# Patient Record
Sex: Female | Born: 1985 | Race: Black or African American | Hispanic: No | Marital: Married
Health system: Southern US, Community
[De-identification: ages and names within clinical notes are randomized; demographics above are authoritative.]

## PROBLEM LIST (undated history)

## (undated) DIAGNOSIS — F419 Anxiety disorder, unspecified: Secondary | ICD-10-CM

## (undated) DIAGNOSIS — E785 Hyperlipidemia, unspecified: Secondary | ICD-10-CM

## (undated) DIAGNOSIS — R7303 Prediabetes: Secondary | ICD-10-CM

## (undated) DIAGNOSIS — I10 Essential (primary) hypertension: Secondary | ICD-10-CM

## (undated) DIAGNOSIS — Z973 Presence of spectacles and contact lenses: Secondary | ICD-10-CM

## (undated) DIAGNOSIS — D219 Benign neoplasm of connective and other soft tissue, unspecified: Secondary | ICD-10-CM

## (undated) HISTORY — PX: CHOLECYSTECTOMY, LAPAROSCOPIC: SHX56

## (undated) HISTORY — PX: CHOLECYSTECTOMY: SHX55

---
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
## 2004-03-14 ENCOUNTER — Emergency Department (HOSPITAL_COMMUNITY): Admission: EM | Admit: 2004-03-14 | Discharge: 2004-03-14 | Payer: Self-pay | Admitting: Emergency Medicine

## 2004-07-12 HISTORY — PX: LAPAROSCOPIC APPENDECTOMY: SHX408

## 2004-10-13 ENCOUNTER — Emergency Department (HOSPITAL_COMMUNITY): Admission: EM | Admit: 2004-10-13 | Discharge: 2004-10-13 | Payer: Self-pay | Admitting: Emergency Medicine

## 2005-04-12 ENCOUNTER — Emergency Department (HOSPITAL_COMMUNITY): Admission: EM | Admit: 2005-04-12 | Discharge: 2005-04-12 | Payer: Self-pay | Admitting: Emergency Medicine

## 2005-04-25 ENCOUNTER — Inpatient Hospital Stay (HOSPITAL_COMMUNITY): Admission: EM | Admit: 2005-04-25 | Discharge: 2005-04-26 | Payer: Self-pay | Admitting: Emergency Medicine

## 2005-04-25 ENCOUNTER — Encounter (INDEPENDENT_AMBULATORY_CARE_PROVIDER_SITE_OTHER): Payer: Self-pay | Admitting: *Deleted

## 2006-01-08 ENCOUNTER — Emergency Department (HOSPITAL_COMMUNITY): Admission: EM | Admit: 2006-01-08 | Discharge: 2006-01-08 | Payer: Self-pay | Admitting: *Deleted

## 2006-02-21 ENCOUNTER — Emergency Department (HOSPITAL_COMMUNITY): Admission: EM | Admit: 2006-02-21 | Discharge: 2006-02-21 | Payer: Self-pay | Admitting: Emergency Medicine

## 2006-05-09 ENCOUNTER — Emergency Department (HOSPITAL_COMMUNITY): Admission: EM | Admit: 2006-05-09 | Discharge: 2006-05-10 | Payer: Self-pay | Admitting: Emergency Medicine

## 2006-05-22 ENCOUNTER — Emergency Department (HOSPITAL_COMMUNITY): Admission: EM | Admit: 2006-05-22 | Discharge: 2006-05-22 | Payer: Self-pay | Admitting: Emergency Medicine

## 2006-08-12 ENCOUNTER — Emergency Department (HOSPITAL_COMMUNITY): Admission: EM | Admit: 2006-08-12 | Discharge: 2006-08-12 | Payer: Self-pay | Admitting: Emergency Medicine

## 2006-11-25 ENCOUNTER — Emergency Department (HOSPITAL_COMMUNITY): Admission: EM | Admit: 2006-11-25 | Discharge: 2006-11-26 | Payer: Self-pay | Admitting: Emergency Medicine

## 2006-11-29 ENCOUNTER — Emergency Department (HOSPITAL_COMMUNITY): Admission: EM | Admit: 2006-11-29 | Discharge: 2006-11-29 | Payer: Self-pay | Admitting: Emergency Medicine

## 2006-12-05 ENCOUNTER — Emergency Department (HOSPITAL_COMMUNITY): Admission: EM | Admit: 2006-12-05 | Discharge: 2006-12-05 | Payer: Self-pay | Admitting: Emergency Medicine

## 2006-12-07 ENCOUNTER — Emergency Department (HOSPITAL_COMMUNITY): Admission: EM | Admit: 2006-12-07 | Discharge: 2006-12-07 | Payer: Self-pay | Admitting: Emergency Medicine

## 2007-03-03 ENCOUNTER — Inpatient Hospital Stay (HOSPITAL_COMMUNITY): Admission: AD | Admit: 2007-03-03 | Discharge: 2007-03-03 | Payer: Self-pay | Admitting: Obstetrics & Gynecology

## 2008-01-02 ENCOUNTER — Emergency Department (HOSPITAL_COMMUNITY): Admission: EM | Admit: 2008-01-02 | Discharge: 2008-01-02 | Payer: Self-pay | Admitting: Emergency Medicine

## 2010-11-27 NOTE — H&P (Signed)
NAMEMAYSOON, LOZADA NO.:  [id number]   MEDICAL RECORD NO.:  [id number]          PATIENT TYPE:  INP   LOCATION:  0103                         FACILITY:  [facility]   PHYSICIAN:  Vikki Ports, MDDATE OF BIRTH:  1986-01-01   DATE OF ADMISSION:  04/25/2005  DATE OF DISCHARGE:                                HISTORY & PHYSICAL   ADMISSION DIAGNOSES:  Abdominal pain,  probably acute appendicitis.   REFERRING PHYSICIAN:  Celene Kras, M.D.   HISTORY OF PRESENT ILLNESS:  The patient is a 25 year old black female with  a 2-day history of lower abdominal pain, particularly in the right lower  quadrant. She underwent workup in the emergency room that showed CT scan  consistent with probable acute appendicitis with thickening of her terminal  ileum and cecum. No free fluid was noted.  The patient had a white count of  26,000 with significant left shift.  She had a fever at home of 101.2 and a  fever in the emergency room of 101.  She does complain of nausea and had 1  episode of emesis after drinking the contrast.   PAST MEDICAL HISTORY:  None.   PAST SURGICAL HISTORY:  None.   SOCIAL HISTORY:  Significant for sexual history which is monogamous with 1  partner for the last 2 years.  She denies any vaginal discharge.   REVIEW OF SYSTEMS:  Significant only for the fever as above.   PHYSICAL EXAMINATION:  GENERAL:  Age-appropriate black female in no  distress.  VITAL SIGNS:  Temperature 101.4, blood pressure 146/102, heart rate 110,  respiratory rate 16.  HEENT:  Benign. Normocephalic and atraumatic.  Pupils equal, round, and  reactive to light.  NECK:  Supple and soft without thyromegaly or cervical adenopathy.  LUNGS:  Clear to auscultation and percussion x2.  HEART: Regular rate and rhythm except for tachycardia with normal PMI.  ABDOMEN: Soft and tender in the lower abdomen extending from the suprapubic  area across the right lower quadrant.  There is  significant rebound in this  area. There is no evidence of abdominal wall hernia or defects.  PELVIC: Exam shows no drainage and no cervical motion tenderness.  EXTREMITIES:  Exam shows no clubbing, cyanosis, or edema.   IMPRESSION:  Probable acute appendicitis.   PLAN:  Diagnostic laparoscopy and probable laparoscopic appendectomy.      Vikki Ports, MD  Electronically Signed     KRH/MEDQ  D:  04/25/2005  T:  04/25/2005  Job:  [id number]   cc:   Celene Kras, MD  Fax: [phone]

## 2010-11-27 NOTE — Op Note (Signed)
Margaret Hahn, Margaret Hahn NO.:  [id number]   MEDICAL RECORD NO.:  [id number]          PATIENT TYPE:  INP   LOCATION:  1609                         FACILITY:  [facility]   PHYSICIAN:  Vikki Ports, MDDATE OF BIRTH:  April 13, 1986   DATE OF PROCEDURE:  04/25/2005  DATE OF DISCHARGE:                                 OPERATIVE REPORT   PREOPERATIVE DIAGNOSIS:  Abdominal pain, probable acute appendicitis.   POSTOPERATIVE DIAGNOSES:  1.  Pelvic inflammatory disease.  2.  Peritonitis.   PROCEDURE:  Diagnostic laparoscopy and laparoscopic appendectomy.   SURGEON:  Vikki Ports, M.D.   ASSISTANT:  Thornton Park. Daphine Deutscher, M.D.   ANESTHESIA:  General.   DESCRIPTION OF PROCEDURE:  The patient was taken to the operating room,  placed in supine position.  After adequate general anesthesia was induced  using endotracheal tube, the abdomen was prepped and draped in the normal  sterile fashion.  Foley catheter was placed.  A right upper quadrant  incision was made and using the OptiVu technique, peritoneal access was  obtained and pneumoperitoneum was obtained.  Under direct visualization,  additional 5 mm trocar was placed in the right upper quadrant and a 12 mm  trocar was placed in the left lower quadrant.  Appendix was visualized and  appeared acutely inflamed and mostly with serositis.  There were a  significant amount of adhesions and free fluid in the pelvis consistent with  pelvic inflammatory disease.  The uterus was not actually visualized, but  there was significant edema down in the pelvis.  Edema and interloop  abscesses were taken down, and the pelvis was copiously irrigated.  The  mesoappendix was taken down using the harmonic scalpel and transected at its  base using a vascular white load GIA stapling device.  It was placed in an  EndoCatch bag and removed through the 12 mm port. After adequate irrigation  of the pelvis, pneumoperitoneum was released.   Trocars were removed.  Skin  incisions were closed with subcuticular 4-0 Monocryl and was injected with  Marcaine.  Steri-Strips and dressings were applied.  The patient tolerated  procedure well and went to PACU in good condition.  Cultures were taken, and  those are pending.  Other specimen is the appendix, which was sent for  pathologic evaluation.      Vikki Ports, MD  Electronically Signed     KRH/MEDQ  D:  04/25/2005  T:  04/25/2005  Job:  [id number]

## 2013-01-03 ENCOUNTER — Emergency Department (HOSPITAL_COMMUNITY)
Admission: EM | Admit: 2013-01-03 | Discharge: 2013-01-03 | Disposition: A | Discharge status: Home / Self Care | Payer: Self-pay | Attending: Emergency Medicine | Admitting: Emergency Medicine

## 2013-01-03 ENCOUNTER — Encounter (HOSPITAL_COMMUNITY): Payer: Self-pay | Admitting: Emergency Medicine

## 2013-01-03 DIAGNOSIS — N764 Abscess of vulva: Secondary | ICD-10-CM | POA: Insufficient documentation

## 2013-01-03 DIAGNOSIS — I10 Essential (primary) hypertension: Secondary | ICD-10-CM | POA: Insufficient documentation

## 2013-01-03 DIAGNOSIS — Z79899 Other long term (current) drug therapy: Secondary | ICD-10-CM | POA: Insufficient documentation

## 2013-01-03 DIAGNOSIS — L0291 Cutaneous abscess, unspecified: Secondary | ICD-10-CM

## 2013-01-03 HISTORY — DX: Essential (primary) hypertension: I10

## 2013-01-03 MED ORDER — SULFAMETHOXAZOLE-TRIMETHOPRIM 800-160 MG PO TABS: 2.0000 | ORAL_TABLET | Freq: Two times a day (BID) | ORAL | Status: DC

## 2013-01-03 MED ORDER — HYDROCODONE-ACETAMINOPHEN 5-325 MG PO TABS: 1.0000 | ORAL_TABLET | Freq: Four times a day (QID) | ORAL | Status: DC | PRN

## 2013-01-03 MED ORDER — OXYCODONE-ACETAMINOPHEN 5-325 MG PO TABS
2.0000 | ORAL_TABLET | Freq: Once | ORAL | Status: AC
  Administered 2013-01-03: 2 via ORAL
  Filled 2013-01-03: qty 2

## 2013-01-03 NOTE — ED Notes (Signed)
Pt states that she noticed a vaginal abscess yesterday.  States she has never had one before.

## 2013-01-03 NOTE — ED Provider Notes (Signed)
History    CSN: [id number] Arrival date & time 01/03/13  1233  First MD Initiated Contact with Patient 01/03/13 1342     Chief Complaint  Patient presents with  . Vaginal Abscess    (Consider location/radiation/quality/duration/timing/severity/associated sxs/prior Treatment) HPI Comments: Patient states that she has had an abscess of the right labia for the past week, which is gradually worsening.  She reports that she attempted to pop the area yesterday and had a small amount of drainage.  She reports that the area is tender.  She denies prior history of a vaginal abscess.  She denies fever or chills.  Denies nausea or vomiting.  Denies abdominal pain.  Denies vaginal discharge.  Denies history of DM.    The history is provided by the patient.   Past Medical History  Diagnosis Date  . Hypertension    Past Surgical History  Procedure Laterality Date  . Cholecystectomy     History reviewed. No pertinent family history. History  Substance Use Topics  . Smoking status: Never Smoker   . Smokeless tobacco: Not on file  . Alcohol Use: No   OB History   Grav Para Term Preterm Abortions TAB SAB Ect Mult Living                 Review of Systems  Skin: Positive for color change.       abscess    Allergies  Review of patient's allergies indicates no known allergies.  Home Medications   Current Outpatient Rx  Name  Route  Sig  Dispense  Refill  . ibuprofen (ADVIL,MOTRIN) 200 MG tablet   Oral   Take 600 mg by mouth every 8 (eight) hours as needed for pain.         Marland Kitchen lisinopril-hydrochlorothiazide (PRINZIDE,ZESTORETIC) 20-12.5 MG per tablet   Oral   Take 1 tablet by mouth daily.          BP 150/85  Pulse 100  Temp(Src) 98.6 F (37 C) (Oral)  Resp 16  SpO2 95%  LMP 12/25/2012 Physical Exam  Nursing note and vitals reviewed. Constitutional: She appears well-developed and well-nourished.  HENT:  Head: Normocephalic and atraumatic.  Cardiovascular: Normal  rate, regular rhythm and normal heart sounds.   Pulmonary/Chest: Effort normal and breath sounds normal.  Abdominal: Soft. There is no tenderness.  Genitourinary:     Neurological: She is alert.  Skin: Skin is warm and dry.  Psychiatric: She has a normal mood and affect.    ED Course  Procedures (including critical care time) Labs Reviewed - No data to display No results found. No diagnosis found.  INCISION AND DRAINAGE Performed by: Anne Shutter, Sahej Hauswirth Consent: Verbal consent obtained. Risks and benefits: risks, benefits and alternatives were discussed Type: abscess  Body area: right labia majora  Anesthesia: local infiltration  Incision was made with a scalpel.  Local anesthetic: lidocaine 2% with epinephrine  Anesthetic total: 6 ml  Complexity: complex Blunt dissection to break up loculations  Drainage: purulent  Drainage amount: small  Patient tolerance: Patient tolerated the procedure well with no immediate complications.     MDM  Patient presenting with an abscess of the right labia majora.  Incision and drainage performed, but little drainage was expressed.  Patient afebrile and non toxic appearing.  Patient discharged home with antibiotic and instructed to apply warm compresses to the area.  Patient stable for discharge.  Given referral to Gynecology.  Return precautions given.  Pascal Lux [place], PA-C 01/03/13  2303 

## 2013-01-03 NOTE — Progress Notes (Signed)
[facility] has seen patient and provided her with a list of primary care resources, highlighting [facility].

## 2013-01-04 NOTE — ED Provider Notes (Signed)
Medical screening examination/treatment/procedure(s) were performed by non-physician practitioner and as supervising physician I was immediately available for consultation/collaboration.   Benny Lennert, MD 01/04/13 [phone]

## 2013-04-10 ENCOUNTER — Encounter (HOSPITAL_COMMUNITY): Payer: Self-pay | Admitting: Emergency Medicine

## 2013-04-10 ENCOUNTER — Emergency Department (HOSPITAL_COMMUNITY)
Admission: EM | Admit: 2013-04-10 | Discharge: 2013-04-10 | Disposition: A | Discharge status: Home / Self Care | Payer: Self-pay | Attending: Emergency Medicine | Admitting: Emergency Medicine

## 2013-04-10 DIAGNOSIS — N938 Other specified abnormal uterine and vaginal bleeding: Secondary | ICD-10-CM | POA: Insufficient documentation

## 2013-04-10 DIAGNOSIS — N949 Unspecified condition associated with female genital organs and menstrual cycle: Secondary | ICD-10-CM | POA: Insufficient documentation

## 2013-04-10 DIAGNOSIS — Z79899 Other long term (current) drug therapy: Secondary | ICD-10-CM | POA: Insufficient documentation

## 2013-04-10 DIAGNOSIS — A599 Trichomoniasis, unspecified: Secondary | ICD-10-CM

## 2013-04-10 DIAGNOSIS — Z3202 Encounter for pregnancy test, result negative: Secondary | ICD-10-CM | POA: Insufficient documentation

## 2013-04-10 DIAGNOSIS — I10 Essential (primary) hypertension: Secondary | ICD-10-CM | POA: Insufficient documentation

## 2013-04-10 DIAGNOSIS — A5909 Other urogenital trichomoniasis: Secondary | ICD-10-CM | POA: Insufficient documentation

## 2013-04-10 LAB — URINALYSIS, ROUTINE W REFLEX MICROSCOPIC
Bilirubin Urine: NEGATIVE
Glucose, UA: NEGATIVE mg/dL
Ketones, ur: NEGATIVE mg/dL
Nitrite: NEGATIVE
Protein, ur: NEGATIVE mg/dL
Specific Gravity, Urine: 1.022 (ref 1.005–1.030)
pH: 6 (ref 5.0–8.0)

## 2013-04-10 LAB — POCT I-STAT, CHEM 8
Glucose, Bld: 91 mg/dL (ref 70–99)
HCT: 37 % (ref 36.0–46.0)
Sodium: 140 mEq/L (ref 135–145)
TCO2: 24 mmol/L (ref 0–100)

## 2013-04-10 LAB — WET PREP, GENITAL

## 2013-04-10 LAB — CBC
HCT: 35.1 % — ABNORMAL LOW (ref 36.0–46.0)
Hemoglobin: 11.3 g/dL — ABNORMAL LOW (ref 12.0–15.0)
MCHC: 32.2 g/dL (ref 30.0–36.0)
Platelets: 325 10*3/uL (ref 150–400)
RBC: 3.98 MIL/uL (ref 3.87–5.11)
RDW: 14.8 % (ref 11.5–15.5)
WBC: 8.4 10*3/uL (ref 4.0–10.5)

## 2013-04-10 LAB — POCT PREGNANCY, URINE: Preg Test, Ur: NEGATIVE

## 2013-04-10 LAB — URINE MICROSCOPIC-ADD ON

## 2013-04-10 MED ORDER — IBUPROFEN 800 MG PO TABS
ORAL_TABLET | ORAL | Status: AC
  Filled 2013-04-10: qty 1

## 2013-04-10 MED ORDER — METRONIDAZOLE 500 MG PO TABS
ORAL_TABLET | ORAL | Status: AC
  Filled 2013-04-10: qty 4

## 2013-04-10 MED ORDER — METRONIDAZOLE 500 MG PO TABS
2000.0000 mg | ORAL_TABLET | Freq: Once | ORAL | Status: AC
  Administered 2013-04-10: 2000 mg via ORAL

## 2013-04-10 MED ORDER — IBUPROFEN 800 MG PO TABS
800.0000 mg | ORAL_TABLET | Freq: Once | ORAL | Status: AC
  Administered 2013-04-10: 800 mg via ORAL

## 2013-04-10 MED ORDER — ONDANSETRON 4 MG PO TBDP
ORAL_TABLET | ORAL | Status: AC
  Filled 2013-04-10: qty 1

## 2013-04-10 MED ORDER — ONDANSETRON 4 MG PO TBDP
4.0000 mg | ORAL_TABLET | Freq: Once | ORAL | Status: AC
  Administered 2013-04-10: 4 mg via ORAL

## 2013-04-10 NOTE — ED Provider Notes (Signed)
CSN: [id number]     Arrival date & time 04/10/13  1742 History   First MD Initiated Contact with Patient 04/10/13 1839     Chief Complaint  Patient presents with  . Vaginal Bleeding   (Consider location/radiation/quality/duration/timing/severity/associated sxs/prior Treatment) Patient is a 27 y.o. female presenting with vaginal bleeding. The history is provided by the patient.  Vaginal Bleeding Quality:  Clots and dark red Severity:  Moderate Duration:  9 months Timing:  Constant Menstrual history:  Irregular Number of pads used:  8 per day Possible pregnancy: no   Context: spontaneously   Relieved by: unable to tolerate OCP, Ineffective treatments: Unable to tolerate OCPs and metformin. Associated symptoms: no abdominal pain, no back pain, no dizziness, no dyspareunia, no dysuria, no fatigue, no fever, no nausea and no vaginal discharge   Risk factors: does not have multiple partners, no new sexual partner and no STD   Risk factors comment:  PCOS    Past Medical History  Diagnosis Date  . Hypertension    Past Surgical History  Procedure Laterality Date  . Cholecystectomy     No family history on file. History  Substance Use Topics  . Smoking status: Never Smoker   . Smokeless tobacco: Not on file  . Alcohol Use: No   OB History   Grav Para Term Preterm Abortions TAB SAB Ect Mult Living                 Review of Systems  Constitutional: Negative for fever and fatigue.  Gastrointestinal: Negative for nausea and abdominal pain.  Genitourinary: Positive for vaginal bleeding. Negative for dysuria, vaginal discharge and dyspareunia.  Musculoskeletal: Negative for back pain.  Neurological: Negative for dizziness and syncope.  All other systems reviewed and are negative.    Allergies  Review of patient's allergies indicates no known allergies.  Home Medications   Current Outpatient Rx  Name  Route  Sig  Dispense  Refill  . lisinopril-hydrochlorothiazide  (PRINZIDE,ZESTORETIC) 20-12.5 MG per tablet   Oral   Take 1 tablet by mouth daily.         . norgestimate-ethinyl estradiol (ORTHO-CYCLEN,SPRINTEC,PREVIFEM) 0.25-35 MG-MCG tablet   Oral   Take 1 tablet by mouth daily.          BP 127/77  Pulse 68  Temp(Src) 98.6 F (37 C) (Oral)  Resp 20  Wt 300 lb (136.079 kg)  SpO2 99% Physical Exam  Constitutional: She is oriented to person, place, and time. She appears well-developed and well-nourished. No distress.  HENT:  Head: Normocephalic and atraumatic.  Eyes: Conjunctivae are normal. No scleral icterus.  Neck: Normal range of motion.  Cardiovascular: Normal rate, regular rhythm and normal heart sounds.  Exam reveals no gallop and no friction rub.   No murmur heard. Pulmonary/Chest: Effort normal and breath sounds normal. No respiratory distress.  Abdominal: Soft. Bowel sounds are normal. She exhibits no distension and no mass. There is no tenderness. There is no guarding.  Musculoskeletal:  Pelvic exam: normal external genitalia, vulva, vagina, cervix, uterus and adnexa. Bleeding, from cervix.    Neurological: She is alert and oriented to person, place, and time.  Skin: Skin is warm and dry. She is not diaphoretic.    ED Course  Procedures (including critical care time) Labs Review Labs Reviewed  WET PREP, GENITAL - Abnormal; Notable for the following:    Trich, Wet Prep FEW (*)    Clue Cells Wet Prep HPF POC RARE (*)    WBC,  Wet Prep HPF POC FEW (*)    All other components within normal limits  URINALYSIS, ROUTINE W REFLEX MICROSCOPIC - Abnormal; Notable for the following:    APPearance CLOUDY (*)    Hgb urine dipstick TRACE (*)    Leukocytes, UA MODERATE (*)    All other components within normal limits  CBC - Abnormal; Notable for the following:    Hemoglobin 11.3 (*)    HCT 35.1 (*)    All other components within normal limits  GC/CHLAMYDIA PROBE AMP  URINE MICROSCOPIC-ADD ON  POCT PREGNANCY, URINE  POCT  I-STAT, CHEM 8   Imaging Review No results found.  MDM   1. Trichimoniasis   2. DUB (dysfunctional uterine bleeding)    BP 127/77  Pulse 68  Temp(Src) 98.6 F (37 C) (Oral)  Resp 20  Wt 300 lb (136.079 kg)  SpO2 99% Patient is trich positive. Pelvic exam is unremarkable. Will treat here with 2 g flagyl. Hgb stable. Follow up at the [facility]. Return precautions discussed.  Arthor Captain, PA-C 04/14/13 1723

## 2013-04-10 NOTE — ED Notes (Signed)
Patient with vaginal bleeding for nine months.  Patient saw her PCP and he put her on BC pills, but patient not able to tolerate taking them, made her nauseated.  In [place]. [place] she was started on Metformin to hopefully control the bleeding but that made her nauseated as well.  Patient also has intermittent lower abdominal pain that she rates as a 10 now.  No dysuria or other problem with urination.

## 2013-04-16 NOTE — ED Provider Notes (Signed)
  Medical screening examination/treatment/procedure(s) were performed by non-physician practitioner and as supervising physician I was immediately available for consultation/collaboration.    Gerhard Munch, MD 04/16/13 [phone]

## 2013-04-25 ENCOUNTER — Ambulatory Visit (INDEPENDENT_AMBULATORY_CARE_PROVIDER_SITE_OTHER): Payer: Self-pay | Admitting: Family Medicine

## 2013-04-25 ENCOUNTER — Encounter: Payer: Self-pay | Admitting: Family Medicine

## 2013-04-25 VITALS — BP 142/91 | HR 102 | Temp 98.4°F | Ht 66.0 in | Wt 297.2 lb

## 2013-04-25 DIAGNOSIS — N921 Excessive and frequent menstruation with irregular cycle: Secondary | ICD-10-CM | POA: Insufficient documentation

## 2013-04-25 DIAGNOSIS — I10 Essential (primary) hypertension: Secondary | ICD-10-CM

## 2013-04-25 DIAGNOSIS — E282 Polycystic ovarian syndrome: Secondary | ICD-10-CM | POA: Insufficient documentation

## 2013-04-25 MED ORDER — MEGESTROL ACETATE 40 MG PO TABS: 40.0000 mg | ORAL_TABLET | Freq: Every day | ORAL | Status: DC

## 2013-04-25 NOTE — Patient Instructions (Signed)
Metrorrhagia   Metrorrhagia is uterine bleeding at irregular intervals, especially between menstrual periods.   CAUSES    Dysfunctional uterine bleeding.   Uterine lining growing outside the uterus (endometriosis).   Embryo adhering to uterine wall (implantation).   Pregnancy growing in the fallopian tubes (ectopic pregnancy).   Miscarriage.   Menopause.   Cancer of the reproduction organs.   Certain drugs such as hormonal contraceptives.   Inherited bleeding disorders.   Trauma.   Uterine fibroids.   Sexually transmitted diseases (STDs).   Polycystic ovarian disease.  DIAGNOSIS   A history will be taken.   A physical exam will be performed.   Other tests may include:   Blood tests.   A pregnancy test.   An ultrasound of the abdomen and pelvis.   A biopsy of the uterine lining.   AMRI or CT scan of the abdomen and pelvis.  TREATMENT  Treatment will depend on the cause.  HOME CARE INSTRUCTIONS    Take all medicines as directed by your caregiver. Do not change or switch medicines without talking to your caregiver.   Take all iron supplements exactly as directed by your caregiver. Iron supplements help to replace the iron your body loses from irregular bleeding.If you become constipated, increase the amount of fiber, fruits, and vegetables in your diet.   Do not take aspirin or medicines that contain aspirin for 1 week before your menstrual period or during your menstrual period. Aspirin may increase the bleeding.   Rest as much as possible if you change your sanitary pad or tampon more than once every 2 hours.   Eat well-balanced meals including foods high in iron, such as green leafy vegetables, red meat, liver, eggs, and whole-grain breads and cereals.   Do not try to lose weight until the abnormal bleeding is controlled and your blood iron level is back to normal.  SEEK MEDICAL CARE IF:    You have nausea and vomiting, or you cannot keep foods down.   You feel dizzy or have diarrhea  while taking medicine.   You have any problems that may be related to the medicine you are taking.  SEEK IMMEDIATE MEDICAL CARE IF:    You have a fever.   You develop chills.   You become lightheaded or faint.   You need to change your sanitary pad or tampon more than once an hour.   Your bleeding becomesheavy.   You begin to pass clots or tissue.  MAKE SURE YOU:    Understand these instructions.   Will watch your condition.   Will get help right away if you are not doing well or get worse.  Document Released: 06/28/2005 Document Revised: 09/20/2011 Document Reviewed: 01/25/2011  ExitCare Patient Information 2014 [employer].

## 2013-04-25 NOTE — Progress Notes (Signed)
S: 27 y.o. F presents with complaints of persistent vaingal bleeding for the last 10 months. Pt has had long history of PCOS and has trialed metformin in the past. However since January pt has been having persistent vaginal bleeding that has impaired her life with sexual relationships and financially with pads. Pt reports 2-3 pads per day. Pt other wise is feeling well.   PSHx: Cholecystectomy PMhx: HTN, PCOS Meds: Zestoretic, reports not taking orthocyclen All: NKDA  PE: Filed Vitals:   04/25/13 1446  BP: 142/91  Pulse: 102  Temp: 98.4 F (36.9 C)   NAD, VSS, mildly hypertensive CTAB no w/r/c rrr no mgt +Facial hair, mild acanthosis migricans  CBC    Component Value Date/Time   WBC 8.4 04/10/2013 1915   RBC 3.98 04/10/2013 1915   HGB 12.6 04/10/2013 1919   HCT 37.0 04/10/2013 1919   PLT 325 04/10/2013 1915   MCV 88.2 04/10/2013 1915   MCH 28.4 04/10/2013 1915   MCHC 32.2 04/10/2013 1915   RDW 14.8 04/10/2013 1915   CMP     Component Value Date/Time   NA 140 04/10/2013 1919   K 3.7 04/10/2013 1919   CL 105 04/10/2013 1919   GLUCOSE 91 04/10/2013 1919   BUN 11 04/10/2013 1919   CREATININE 1.10 04/10/2013 1919   Pregnancy test neg 9/30 GC/C neg Trich + = reports treated  A/P 27 y.o. F with metrorrhagia likely 2/2 PCOS pt has been previously dx'd with. Pt currently is uninsured, however, she is planning on getting insurance in November. Pt's age makes it unlikely that she has uterine cancer at this time. However, will attempt to tx pt dysfunctional uterine bleeding with megace for stabalization. Then pt to return in 8 wks after she has established her insurance. Once established will further evalaute  - megace taper - f/u in 8 wks  Tawana Scale, MD [facility] Fellow

## 2013-09-04 ENCOUNTER — Emergency Department (HOSPITAL_COMMUNITY)
Admission: EM | Admit: 2013-09-04 | Discharge: 2013-09-04 | Disposition: A | Discharge status: Home / Self Care | Payer: Self-pay | Attending: Emergency Medicine | Admitting: Emergency Medicine

## 2013-09-04 ENCOUNTER — Emergency Department (HOSPITAL_COMMUNITY): Payer: Self-pay

## 2013-09-04 ENCOUNTER — Encounter (HOSPITAL_COMMUNITY): Payer: Self-pay | Admitting: Emergency Medicine

## 2013-09-04 ENCOUNTER — Ambulatory Visit (HOSPITAL_COMMUNITY): Admission: RE | Admit: 2013-09-04 | Payer: Self-pay | Source: Ambulatory Visit

## 2013-09-04 DIAGNOSIS — Y929 Unspecified place or not applicable: Secondary | ICD-10-CM | POA: Insufficient documentation

## 2013-09-04 DIAGNOSIS — I10 Essential (primary) hypertension: Secondary | ICD-10-CM | POA: Insufficient documentation

## 2013-09-04 DIAGNOSIS — S62609A Fracture of unspecified phalanx of unspecified finger, initial encounter for closed fracture: Secondary | ICD-10-CM | POA: Insufficient documentation

## 2013-09-04 DIAGNOSIS — W230XXA Caught, crushed, jammed, or pinched between moving objects, initial encounter: Secondary | ICD-10-CM | POA: Insufficient documentation

## 2013-09-04 DIAGNOSIS — Y939 Activity, unspecified: Secondary | ICD-10-CM | POA: Insufficient documentation

## 2013-09-04 DIAGNOSIS — Z79899 Other long term (current) drug therapy: Secondary | ICD-10-CM | POA: Insufficient documentation

## 2013-09-04 MED ORDER — HYDROCODONE-ACETAMINOPHEN 5-325 MG PO TABS: 1.0000 | ORAL_TABLET | Freq: Four times a day (QID) | ORAL | Status: DC | PRN

## 2013-09-04 NOTE — ED Provider Notes (Signed)
CSN: [id number]     Arrival date & time 09/04/13  1028 History   First MD Initiated Contact with Patient 09/04/13 1040     No chief complaint on file.    (Consider location/radiation/quality/duration/timing/severity/associated sxs/prior Treatment) HPI Comments: Patient presents to the ED with a chief complaint of finger injury.  She states that she jammed her finger last night.  She states that it hurts to move.  Nothing makes the pain better.  She has not tried taking anything to alleviate her symptoms.  Pain is moderate.  No numbness or tingling.  The history is provided by the patient. No language interpreter was used.    Past Medical History  Diagnosis Date  . Hypertension    Past Surgical History  Procedure Laterality Date  . Cholecystectomy     No family history on file. History  Substance Use Topics  . Smoking status: Never Smoker   . Smokeless tobacco: Not on file  . Alcohol Use: No   OB History   Grav Para Term Preterm Abortions TAB SAB Ect Mult Living                 Review of Systems  Constitutional: Negative for fever.  Musculoskeletal: Positive for arthralgias and joint swelling. Negative for myalgias.  Skin: Negative for wound.  Neurological: Negative for numbness.      Allergies  Review of patient's allergies indicates no known allergies.  Home Medications   Current Outpatient Rx  Name  Route  Sig  Dispense  Refill  . lisinopril-hydrochlorothiazide (PRINZIDE,ZESTORETIC) 20-12.5 MG per tablet   Oral   Take 1 tablet by mouth daily.          There were no vitals taken for this visit. Physical Exam  Nursing note and vitals reviewed. Constitutional: She is oriented to person, place, and time. She appears well-developed and well-nourished.  HENT:  Head: Normocephalic and atraumatic.  Eyes: Conjunctivae and EOM are normal.  Neck: Normal range of motion.  Cardiovascular: Normal rate and intact distal pulses.   Brisk capillary refill   Pulmonary/Chest: Effort normal.  Abdominal: She exhibits no distension.  Musculoskeletal: Normal range of motion.  Left index finger ROM and strength is reduced 2/2 pain, no obvious deformity or abnormality  Neurological: She is alert and oriented to person, place, and time.  Sensation intact  Skin: Skin is dry.  Psychiatric: She has a normal mood and affect. Her behavior is normal. Judgment and thought content normal.    ED Course  Procedures (including critical care time) Labs Review Labs Reviewed - No data to display Imaging Review No results found.  EKG Interpretation   None       MDM   Final diagnoses:  Finger fracture    Patient with jammed finger.  Will check plain films.  2:59 PM Patient with finger fracture. Attempted to straighten, and applied a splint. I also give the patient a digital block, which provided significant relief. Recommend followup with orthopedics/hand. Patient understands and agrees to plan. She is stable and ready for discharge.    Montine Circle, PA-C 09/04/13 1500

## 2013-09-04 NOTE — Discharge Instructions (Signed)
Finger Fracture  Fractures of fingers are breaks in the bones of the fingers. There are many types of fractures. There are different ways of treating these fractures. Your health care provider will discuss the best way to treat your fracture.  CAUSES  Traumatic injury is the main cause of broken fingers. These include:  · Injuries while playing sports.  · Workplace injuries.  · Falls.  RISK FACTORS  Activities that can increase your risk of finger fractures include:  · Sports.  · Workplace activities that involve machinery.  · A condition called osteoporosis, which can make your bones less dense and cause them to fracture more easily.  SIGNS AND SYMPTOMS  The main symptoms of a broken finger are pain and swelling within 15 minutes after the injury. Other symptoms include:  · Bruising of your finger.  · Stiffness of your finger.  · Numbness of your finger.  · Exposed bones (compound fracture) if the fracture is severe.  DIAGNOSIS   The best way to diagnose a broken bone is with X-ray imaging. Additionally, your health care provider will use this X-ray image to evaluate the position of the broken finger bones.   TREATMENT   Finger fractures can be treated with:   · Nonreduction This means the bones are in place. The finger is splinted without changing the positions of the bone pieces. The splint is usually left on for about a week to 10 days. This will depend on your fracture and what your health care provider thinks.  · Closed reduction The bones are put back into position without using surgery. The finger is then splinted.  · Open reduction and internal fixation The fracture site is opened. Then the bone pieces are fixed into place with pins or some type of hardware. This is seldom required. It depends on the severity of the fracture.  HOME CARE INSTRUCTIONS   · Follow your health care provider's instructions regarding activities, exercises, and physical therapy.  · Only take over-the-counter or prescription  medicines for pain, discomfort, or fever as directed by your health care provider.  SEEK MEDICAL CARE IF:  You have pain or swelling that limits the motion or use of your fingers.  SEEK IMMEDIATE MEDICAL CARE IF:   Your finger becomes numb.  MAKE SURE YOU:   · Understand these instructions.  · Will watch your condition.  · Will get help right away if you are not doing well or get worse.  Document Released: 10/10/2000 Document Revised: 04/18/2013 Document Reviewed: 02/07/2013  Exit[employer]® Patient Information ©2014 [employer].

## 2013-09-04 NOTE — ED Notes (Signed)
Pt reports pain and swelling in 2nd finger (index finger ) on r/hand.Pt reports that she accidentally bumped a wall will an outstreached hand. Decreased ROM note in finger. Pt AO x 3.

## 2013-09-05 NOTE — ED Provider Notes (Signed)
Medical screening examination/treatment/procedure(s) were performed by non-physician practitioner and as supervising physician I was immediately available for consultation/collaboration.  EKG Interpretation   None        Keydi Giel R. Kastin Cerda, MD 09/05/13 0650 

## 2015-05-05 ENCOUNTER — Emergency Department (HOSPITAL_COMMUNITY)
Admission: EM | Admit: 2015-05-05 | Discharge: 2015-05-05 | Disposition: A | Discharge status: Home / Self Care | Payer: 59 | Attending: Emergency Medicine | Admitting: Emergency Medicine

## 2015-05-05 ENCOUNTER — Encounter (HOSPITAL_COMMUNITY): Payer: Self-pay | Admitting: Vascular Surgery

## 2015-05-05 DIAGNOSIS — L259 Unspecified contact dermatitis, unspecified cause: Secondary | ICD-10-CM | POA: Diagnosis not present

## 2015-05-05 DIAGNOSIS — Z3202 Encounter for pregnancy test, result negative: Secondary | ICD-10-CM | POA: Diagnosis not present

## 2015-05-05 DIAGNOSIS — N898 Other specified noninflammatory disorders of vagina: Secondary | ICD-10-CM | POA: Diagnosis present

## 2015-05-05 DIAGNOSIS — Z79899 Other long term (current) drug therapy: Secondary | ICD-10-CM | POA: Diagnosis not present

## 2015-05-05 DIAGNOSIS — I10 Essential (primary) hypertension: Secondary | ICD-10-CM | POA: Diagnosis not present

## 2015-05-05 DIAGNOSIS — L309 Dermatitis, unspecified: Secondary | ICD-10-CM

## 2015-05-05 LAB — WET PREP, GENITAL
Trich, Wet Prep: NONE SEEN
Yeast Wet Prep HPF POC: NONE SEEN

## 2015-05-05 LAB — POC URINE PREG, ED: PREG TEST UR: NEGATIVE

## 2015-05-05 MED ORDER — FLUCONAZOLE 100 MG PO TABS
150.0000 mg | ORAL_TABLET | Freq: Once | ORAL | Status: AC
  Administered 2015-05-05: 100 mg via ORAL
  Filled 2015-05-05: qty 2

## 2015-05-05 NOTE — ED Provider Notes (Signed)
CSN: [id number]     Arrival date & time 05/05/15  1440 History  By signing my name below, I, Soijett Blue, attest that this documentation has been prepared under the direction and in the presence of Lenn Sink, PA-C Electronically Signed: Soijett Blue, ED Scribe. 05/05/2015. 3:43 PM.   Chief Complaint  Patient presents with  . Vaginal Itching      The history is provided by the patient. No language interpreter was used.    Margaret Hahn is a 29 y.o. female who presents to the Emergency Department complaining of vaginal itching vaginal itching/burning onset 1 week ago. She notes that she began to use a new soap 2 weeks ago and there is itching/burning/redness/swelling to her labia since using the new soap. Pt reports that she is sexually active but without penetration as she is a lesbian. She states that she is having associated symptoms of vaginal redness and swelling. She states that she has not tried any medication but she used coconut oil that temporarily relieved her symptoms. She denies vaginal discharge, fever, chills, and any other symptoms. She notes that she has had a yeast infection in the past and her symptoms are similar to that. Denies being on abx lately and she notes that she is not a diabetic but she does have HTN.     Past Medical History  Diagnosis Date  . Hypertension    Past Surgical History  Procedure Laterality Date  . Cholecystectomy     Family History  Problem Relation Age of Onset  . Diabetes Mother    Social History  Substance Use Topics  . Smoking status: Never Smoker   . Smokeless tobacco: None  . Alcohol Use: No   OB History    No data available     Review of Systems  Constitutional: Negative for fever and chills.  Genitourinary: Negative for vaginal discharge.       Vaginal itching/redness/swelling      Allergies  Review of patient's allergies indicates no known allergies.  Home Medications   Prior to Admission medications    Medication Sig Start Date End Date Taking? Authorizing Provider  HYDROcodone-acetaminophen (NORCO/VICODIN) 5-325 MG per tablet Take 1-2 tablets by mouth every 6 (six) hours as needed. 09/04/13   Montine Circle, PA-C  lisinopril-hydrochlorothiazide (PRINZIDE,ZESTORETIC) 20-12.5 MG per tablet Take 1 tablet by mouth daily.    Historical Provider, MD   BP 138/82 mmHg  Pulse 76  Temp(Src) 97.3 F (36.3 C) (Oral)  Resp 20  SpO2 98% Physical Exam  Constitutional: She is oriented to person, place, and time. She appears well-developed and well-nourished. No distress.  HENT:  Head: Normocephalic and atraumatic.  Eyes: EOM are normal.  Neck: Neck supple.  Cardiovascular: Normal rate.   Pulmonary/Chest: Effort normal. No respiratory distress.  Abdominal: Soft. There is no tenderness.  Genitourinary: Vagina normal. There is no rash, tenderness, lesion or injury on the right labia. There is no tenderness, lesion or injury on the left labia. Cervix exhibits no discharge. No erythema, tenderness or bleeding in the vagina. No foreign body around the vagina. No signs of injury around the vagina. No vaginal discharge found.  Light pink tinting to swab.   Musculoskeletal: Normal range of motion.  Neurological: She is alert and oriented to person, place, and time.  Skin: Skin is warm and dry.  Psychiatric: She has a normal mood and affect. Her behavior is normal.  Nursing note and vitals reviewed.   ED Course  Procedures (including  critical care time) DIAGNOSTIC STUDIES: Oxygen Saturation is 98% on RA, nl by my interpretation.    COORDINATION OF CARE: 3:44 PM Discussed treatment plan with pt at bedside which includes UA, pelvic exam, and wet prep and pt agreed to plan.    Labs Review Labs Reviewed  WET PREP, GENITAL - Abnormal; Notable for the following:    Clue Cells Wet Prep HPF POC FEW (*)    WBC, Wet Prep HPF POC FEW (*)    All other components within normal limits  POC URINE PREG, ED   CERVICOVAGINAL ANCILLARY ONLY    Imaging Review No results found. I have personally reviewed and evaluated these lab results as part of my medical decision-making.   EKG Interpretation None      MDM   Final diagnoses:  Dermatitis    Labs: wet prep: few clue cells and few WBC  Imaging:   Consults:   Therapeutics:   Discharge Meds: Fluconazole  Assessment/Plan: Patient presents with irritation to the labia major, no significant findings on exam. Patient reports this is identical to previous yeast infections, wet prep shows no signs of yeast, physical exam shows no signs. Patient insisting that this is identical to previous infection that was resolved with 1 dose of fluconazole. Patient will be given 1 dose of fluconazole, this does not improve she is instructed follow-up with her primary care for reevaluation and further management. Patient verbalizes understanding and agreement for today's plan and had no further questions or concerns at time of discharge  I personally performed the services described in this documentation, which was scribed in my presence. The recorded information has been reviewed and is accurate.    Okey Regal, PA-C 05/06/15 5277  Evelina Bucy, MD 05/08/15 [phone]

## 2015-05-05 NOTE — Discharge Instructions (Signed)
°  Please follow-up with PCP for further evaluation and management if symptoms persist

## 2015-05-05 NOTE — ED Notes (Signed)
Pt reports to the ED for eval of vaginal irritation. She reports she started using a new soap on her vaginal area and now she is having vaginal itching and irritation. Also reports its is erythematous and swollen. Denies any unprotected sexual intercourse recently. Pt also denies any fevers, chills, or urinary symptoms. Pt A&Ox4, resp e/u, and skin warm and dry.

## 2015-05-06 LAB — CERVICOVAGINAL ANCILLARY ONLY
Chlamydia: NEGATIVE
Neisseria Gonorrhea: NEGATIVE

## 2015-07-07 ENCOUNTER — Encounter (HOSPITAL_COMMUNITY): Payer: Self-pay | Admitting: Oncology

## 2015-07-07 ENCOUNTER — Emergency Department (HOSPITAL_COMMUNITY)
Admission: EM | Admit: 2015-07-07 | Discharge: 2015-07-07 | Disposition: A | Discharge status: Home / Self Care | Payer: 59 | Attending: Emergency Medicine | Admitting: Emergency Medicine

## 2015-07-07 DIAGNOSIS — Z3202 Encounter for pregnancy test, result negative: Secondary | ICD-10-CM | POA: Diagnosis not present

## 2015-07-07 DIAGNOSIS — I10 Essential (primary) hypertension: Secondary | ICD-10-CM | POA: Insufficient documentation

## 2015-07-07 DIAGNOSIS — N921 Excessive and frequent menstruation with irregular cycle: Secondary | ICD-10-CM | POA: Insufficient documentation

## 2015-07-07 DIAGNOSIS — Z79899 Other long term (current) drug therapy: Secondary | ICD-10-CM | POA: Insufficient documentation

## 2015-07-07 DIAGNOSIS — N939 Abnormal uterine and vaginal bleeding, unspecified: Secondary | ICD-10-CM | POA: Diagnosis present

## 2015-07-07 LAB — CBC WITH DIFFERENTIAL/PLATELET
Basophils Absolute: 0 10*3/uL (ref 0.0–0.1)
Basophils Relative: 0 %
Eosinophils Absolute: 0.1 10*3/uL (ref 0.0–0.7)
Eosinophils Relative: 1 %
HCT: 39 % (ref 36.0–46.0)
Hemoglobin: 12.4 g/dL (ref 12.0–15.0)
Lymphocytes Relative: 38 %
Lymphs Abs: 2.2 10*3/uL (ref 0.7–4.0)
MCH: 28.8 pg (ref 26.0–34.0)
MCHC: 31.8 g/dL (ref 30.0–36.0)
MCV: 90.5 fL (ref 78.0–100.0)
Monocytes Absolute: 0.5 10*3/uL (ref 0.1–1.0)
Monocytes Relative: 9 %
Neutro Abs: 3.1 10*3/uL (ref 1.7–7.7)
Neutrophils Relative %: 52 %
Platelets: 354 10*3/uL (ref 150–400)
RBC: 4.31 MIL/uL (ref 3.87–5.11)
RDW: 14.5 % (ref 11.5–15.5)
WBC: 5.9 10*3/uL (ref 4.0–10.5)

## 2015-07-07 LAB — PREGNANCY, URINE: Preg Test, Ur: NEGATIVE

## 2015-07-07 MED ORDER — IBUPROFEN 800 MG PO TABS
800.0000 mg | ORAL_TABLET | Freq: Once | ORAL | Status: AC
  Administered 2015-07-07: 800 mg via ORAL
  Filled 2015-07-07: qty 1

## 2015-07-07 MED ORDER — ETHYNODIOL DIAC-ETH ESTRADIOL 1-35 MG-MCG PO TABS: 1.0000 | ORAL_TABLET | Freq: Two times a day (BID) | ORAL | Status: DC

## 2015-07-07 NOTE — Discharge Instructions (Signed)
Take medication as directed - you will take two tablets per day until bleeding stops, but do not exceed 7 days of treatment. You should follow up with the women's health clinic or your OBGYN provider at their next available appointment for discussion of today's diagnosis and further evaluation and management. I would prefer you see the women's specialist in the next 1-3 days. Ibuprofen as needed for pain. Please return to the ER for any new or worsening symptoms, any additional concerns.

## 2015-07-07 NOTE — ED Provider Notes (Signed)
CSN: [id number]     Arrival date & time 07/07/15  [id number] History   First MD Initiated Contact with Patient 07/07/15 [phone]     Chief Complaint  Patient presents with  . Vaginal Bleeding     (Consider location/radiation/quality/duration/timing/severity/associated sxs/prior Treatment) Patient is a 29 y.o. female presenting with vaginal bleeding. The history is provided by the patient and medical records. No language interpreter was used.  Vaginal Bleeding Associated symptoms: no abdominal pain, no back pain, no dizziness, no dysuria, no nausea and no vaginal discharge    Margaret Hahn is a 29 y.o. female  with a PMH of PCOS, HTN who presents to the Emergency Department complaining of vaginal bleeding. Patient states she has been on her menstrual cycle for approx. 4 weeks - flow has not increased or decreased throughout the month. She is using a package of 48 pads each day. This has occurred several times in the past 3 years. She has seen OBGYN previously and told "everything looked fine" Admits to weakness, fatigue. Denies bleeding d/o's, recent illness, new medications. She states recent STD check with normal results.    Past Medical History  Diagnosis Date  . Hypertension    Past Surgical History  Procedure Laterality Date  . Cholecystectomy     Family History  Problem Relation Age of Onset  . Diabetes Mother    Social History  Substance Use Topics  . Smoking status: Never Smoker   . Smokeless tobacco: None  . Alcohol Use: No   OB History    No data available     Review of Systems  Constitutional: Negative.   HENT: Negative for congestion, rhinorrhea and sore throat.   Eyes: Negative for visual disturbance.  Respiratory: Negative for cough, shortness of breath and wheezing.   Cardiovascular: Negative.   Gastrointestinal: Negative for nausea, vomiting, abdominal pain, diarrhea and constipation.  Genitourinary: Positive for vaginal bleeding, menstrual problem and pelvic pain.  Negative for dysuria, urgency, frequency and vaginal discharge.  Musculoskeletal: Negative for myalgias, back pain, arthralgias and neck pain.  Skin: Negative for rash.  Neurological: Negative for dizziness, weakness and headaches.      Allergies  Review of patient's allergies indicates no known allergies.  Home Medications   Prior to Admission medications   Medication Sig Start Date End Date Taking? Authorizing Provider  lisinopril-hydrochlorothiazide (PRINZIDE,ZESTORETIC) 20-12.5 MG per tablet Take 1 tablet by mouth daily.   Yes Historical Provider, MD  ethynodiol-ethinyl estradiol Celso Sickle) 1-35 MG-MCG tablet Take 1 tablet by mouth 2 (two) times daily. Until bleeding stops - up to 7 days. 07/07/15   Ozella Almond Trace Wirick, PA-C  phentermine (ADIPEX-P) 37.5 MG tablet Take 37.5 mg by mouth daily. 06/27/15   Historical Provider, MD   BP 128/88 mmHg  Pulse 69  Temp(Src) 98.9 F (37.2 C) (Oral)  Resp 17  Ht 5\' 4"  (1.626 m)  Wt 136.079 kg  BMI 51.47 kg/m2  SpO2 98%  LMP  Physical Exam  Constitutional: She is oriented to person, place, and time. She appears well-developed and well-nourished.  Alert and in no acute distress  HENT:  Head: Normocephalic and atraumatic.  Cardiovascular: Normal rate, regular rhythm, normal heart sounds and intact distal pulses.  Exam reveals no gallop and no friction rub.   No murmur heard. Pulmonary/Chest: Effort normal and breath sounds normal. No respiratory distress. She has no wheezes. She has no rales. She exhibits no tenderness.  Abdominal: She exhibits no mass. There is no rebound and no guarding.  Abdomen soft, non-distended TTP as depicted in image.  Bowel sounds positive in all four quadrants  Musculoskeletal: She exhibits no edema.  Neurological: She is alert and oriented to person, place, and time.  Skin: Skin is warm and dry. No rash noted.  Psychiatric: She has a normal mood and affect. Her behavior is normal. Judgment and  thought content normal.  Nursing note and vitals reviewed.   ED Course  Procedures (including critical care time) Labs Review Labs Reviewed  CBC WITH DIFFERENTIAL/PLATELET  PREGNANCY, URINE    Imaging Review No results found. I have personally reviewed and evaluated these images and lab results as part of my medical decision-making.   EKG Interpretation None      MDM   Final diagnoses:  Metrorrhagia   Margaret Hahn presents with one month metorrhagia. Hx of similar events. Seen by OBGYN for issue previously and tx as DUB.  Pt. Is complaining of pelvic pain as well today. Offered pelvic exam which patient declined - she states she recently had std check and everything was normal - would not like to repeat these tests. Informed pt. That pelvic shows more than just infection, but she still did not want exam. Discussed option of pelvic U/S with patient which she also declined, stating that was done 1-2 years ago and no abnlities were found.   Labs: CBC wdl, upreg negative Therapeutics: ibuprofen   A&P: Metrorrhagia  - ethinyl estradiol short course; stressed importance of OBGYN follow up  - Asked patient again if she would like pelvic or [place], which she again denied.   Patient discussed with Dr. Lacinda Axon who agrees with treatment plan.    [facility] Samariah Hokenson, PA-C 07/07/15 [id number]  Nat Christen, MD 07/07/15 [phone]

## 2015-07-07 NOTE — ED Notes (Signed)
Pt states she has been on her period for 1 month.  During that time pt reports using 8 pads per hour.  Pt has seen here PCP as well as GYN who both told her there was nothing wrong.

## 2015-07-22 ENCOUNTER — Encounter: Payer: Self-pay | Admitting: Obstetrics and Gynecology

## 2015-08-15 ENCOUNTER — Encounter (HOSPITAL_COMMUNITY): Payer: Self-pay

## 2015-08-15 ENCOUNTER — Emergency Department (HOSPITAL_COMMUNITY)
Admission: EM | Admit: 2015-08-15 | Discharge: 2015-08-16 | Disposition: A | Discharge status: Home / Self Care | Payer: Self-pay | Attending: Emergency Medicine | Admitting: Emergency Medicine

## 2015-08-15 DIAGNOSIS — Z79899 Other long term (current) drug therapy: Secondary | ICD-10-CM | POA: Insufficient documentation

## 2015-08-15 DIAGNOSIS — E860 Dehydration: Secondary | ICD-10-CM | POA: Insufficient documentation

## 2015-08-15 DIAGNOSIS — I10 Essential (primary) hypertension: Secondary | ICD-10-CM | POA: Insufficient documentation

## 2015-08-15 DIAGNOSIS — F17200 Nicotine dependence, unspecified, uncomplicated: Secondary | ICD-10-CM | POA: Insufficient documentation

## 2015-08-15 DIAGNOSIS — R110 Nausea: Secondary | ICD-10-CM | POA: Insufficient documentation

## 2015-08-15 DIAGNOSIS — Z3202 Encounter for pregnancy test, result negative: Secondary | ICD-10-CM | POA: Insufficient documentation

## 2015-08-15 LAB — URINALYSIS, ROUTINE W REFLEX MICROSCOPIC
Bilirubin Urine: NEGATIVE
Glucose, UA: NEGATIVE mg/dL
Ketones, ur: NEGATIVE mg/dL
Leukocytes, UA: NEGATIVE
Nitrite: NEGATIVE
Protein, ur: NEGATIVE mg/dL
Specific Gravity, Urine: 1.026 (ref 1.005–1.030)
pH: 6 (ref 5.0–8.0)

## 2015-08-15 LAB — BASIC METABOLIC PANEL
Anion gap: 11 (ref 5–15)
BUN: 13 mg/dL (ref 6–20)
CO2: 26 mmol/L (ref 22–32)
Calcium: 8.9 mg/dL (ref 8.9–10.3)
Chloride: 104 mmol/L (ref 101–111)
Creatinine, Ser: 0.83 mg/dL (ref 0.44–1.00)
GFR calc Af Amer: >60 mL/min (ref >60)
GFR calc non Af Amer: >60 mL/min (ref >60)
Glucose, Bld: 128 mg/dL — ABNORMAL HIGH (ref 65–99)
Potassium: 3.7 mmol/L (ref 3.5–5.1)
Sodium: 141 mmol/L (ref 135–145)

## 2015-08-15 LAB — URINE MICROSCOPIC-ADD ON

## 2015-08-15 LAB — CBC WITH DIFFERENTIAL/PLATELET
Basophils Absolute: 0 10*3/uL (ref 0.0–0.1)
Basophils Relative: 0 %
Eosinophils Absolute: 0.1 10*3/uL (ref 0.0–0.7)
Eosinophils Relative: 1 %
HCT: 39.4 % (ref 36.0–46.0)
Hemoglobin: 12.7 g/dL (ref 12.0–15.0)
Lymphocytes Relative: 31 %
Lymphs Abs: 2.8 10*3/uL (ref 0.7–4.0)
MCH: 28.4 pg (ref 26.0–34.0)
MCHC: 32.2 g/dL (ref 30.0–36.0)
MCV: 88.1 fL (ref 78.0–100.0)
Monocytes Absolute: 0.7 10*3/uL (ref 0.1–1.0)
Monocytes Relative: 7 %
Neutro Abs: 5.5 10*3/uL (ref 1.7–7.7)
Neutrophils Relative %: 61 %
Platelets: 340 10*3/uL (ref 150–400)
RBC: 4.47 MIL/uL (ref 3.87–5.11)
RDW: 14.8 % (ref 11.5–15.5)
WBC: 9.1 10*3/uL (ref 4.0–10.5)

## 2015-08-15 LAB — PREGNANCY, URINE: Preg Test, Ur: NEGATIVE

## 2015-08-15 MED ORDER — SODIUM CHLORIDE 0.9 % IV BOLUS (SEPSIS)
1000.0000 mL | Freq: Once | INTRAVENOUS | Status: AC
  Administered 2015-08-15: 1000 mL via INTRAVENOUS

## 2015-08-15 NOTE — ED Notes (Signed)
Patient c/o possible seizure activity today around 8pm.  Witness explained that patient was sitting on the couch and began to slightly shake.  Patient did not respond to witness and witness states that lasted about 15-20 seconds.  Patient denies a Hx of seizures.  Witness states that when patient "came to" that she was sweating and thirsty.  Witness denies patient hitting her head.  Patient states that gave plasma today around 0330 and this was her third time donating.  Patient denies pain.  Patient states that she is "tired and nauseas".

## 2015-08-15 NOTE — ED Provider Notes (Signed)
CSN: [id number]     Arrival date & time 08/15/15  2113 History   First MD Initiated Contact with Patient 08/15/15 2147     Chief Complaint  Patient presents with  . Seizures     (Consider location/radiation/quality/duration/timing/severity/associated sxs/prior Treatment) HPI Patient presents to the Emergency Department after a likely syncopal episode after donating plasma today. The patient states that she had a headache, then she started feeling nauseous, lightheaded, began hearing a ringing in her ears and then does not remember anything after that. There was a witness present while the episode happens who reports that the patient just shut her eyes, clenched her fists and they began to "twitch." Witness states that the patient was very sweaty. The patient was out for 15-20 seconds according to the witness, and once she woke up she was able to respond to commands, however the witness claims that it took 15 minutes for the patient to return to her normal level of consciousness. When the patient woke up she states that she was very thirsty and that she continues to feel tired and nauseous.  Past Medical History  Diagnosis Date  . Hypertension    Past Surgical History  Procedure Laterality Date  . Cholecystectomy     Family History  Problem Relation Age of Onset  . Diabetes Mother    Social History  Substance Use Topics  . Smoking status: Current Every Day Smoker  . Smokeless tobacco: None  . Alcohol Use: No   OB History    No data available     Review of Systems  Neurological: Positive for seizures.   All other systems negative except as documented in the HPI. All pertinent positives and negatives as reviewed in the HPI.    Allergies  Review of patient's allergies indicates no known allergies.  Home Medications   Prior to Admission medications   Medication Sig Start Date End Date Taking? Authorizing Provider  cetirizine (ZYRTEC) 10 MG tablet Take 10 mg by mouth daily as  needed for allergies.   Yes Historical Provider, MD  ibuprofen (ADVIL,MOTRIN) 200 MG tablet Take 600 mg by mouth every 6 (six) hours as needed for headache or moderate pain.   Yes Historical Provider, MD  lisinopril-hydrochlorothiazide (PRINZIDE,ZESTORETIC) 20-25 MG tablet Take 1 tablet by mouth daily. 07/25/15  Yes Historical Provider, MD  Multiple Vitamin (MULTIVITAMIN WITH MINERALS) TABS tablet Take 1 tablet by mouth daily.   Yes Historical Provider, MD  phentermine (ADIPEX-P) 37.5 MG tablet Take 37.5 mg by mouth daily. 06/27/15  Yes Historical Provider, MD   BP 99/58 mmHg  Pulse 75  Temp(Src) 97.8 F (36.6 C) (Oral)  Resp 20  SpO2 100%  LMP 07/21/2015 (Exact Date) Physical Exam  Constitutional: She is oriented to person, place, and time. She appears well-developed and well-nourished. No distress.  HENT:  Head: Normocephalic and atraumatic.  Eyes: Conjunctivae and EOM are normal. Pupils are equal, round, and reactive to light. Right eye exhibits no discharge. Left eye exhibits no discharge.  Neck: Normal range of motion.  Cardiovascular: Normal rate, regular rhythm, normal heart sounds and intact distal pulses.  Exam reveals no gallop and no friction rub.   No murmur heard. Pulmonary/Chest: Effort normal and breath sounds normal. No respiratory distress. She has no wheezes. She has no rales. She exhibits no tenderness.  Abdominal: Soft. Bowel sounds are normal. She exhibits no distension. There is no tenderness. There is no rebound and no guarding.  Musculoskeletal: Normal range of motion.  Neurological:  She is alert and oriented to person, place, and time. She has normal strength. No cranial nerve deficit or sensory deficit.  Skin: Skin is warm and dry. She is not diaphoretic.  Psychiatric: She has a normal mood and affect. Her behavior is normal. Judgment and thought content normal.    ED Course  Procedures (including critical care time) Labs Review Labs Reviewed  URINALYSIS,  ROUTINE W REFLEX MICROSCOPIC (NOT AT [facility] - Abnormal; Notable for the following:    APPearance CLOUDY (*)    Hgb urine dipstick TRACE (*)    All other components within normal limits  BASIC METABOLIC PANEL - Abnormal; Notable for the following:    Glucose, Bld 128 (*)    All other components within normal limits  URINE MICROSCOPIC-ADD ON - Abnormal; Notable for the following:    Squamous Epithelial / LPF 6-30 (*)    Bacteria, UA FEW (*)    All other components within normal limits  CBC WITH DIFFERENTIAL/PLATELET  PREGNANCY, URINE    Imaging Review No results found. I have personally reviewed and evaluated these images and lab results as part of my medical decision-making.  Patient will be discharged home, told to return here as needed.  This is most likely related to her plasma donation.  She was given IV fluids.  Her vital signs remained stable and have improved following the fluids  Dalia Heading, PA-C 08/16/15 0017  Blanchie Dessert, MD 08/16/15 [phone]

## 2015-08-16 NOTE — Discharge Instructions (Signed)
Return here as needed.  Follow-up with a primary care Dr. Kalman Drape your fluid intake

## 2016-06-21 ENCOUNTER — Emergency Department (HOSPITAL_COMMUNITY)
Admission: EM | Admit: 2016-06-21 | Discharge: 2016-06-21 | Disposition: A | Discharge status: Home / Self Care | Payer: Self-pay | Attending: Emergency Medicine | Admitting: Emergency Medicine

## 2016-06-21 ENCOUNTER — Encounter (HOSPITAL_COMMUNITY): Payer: Self-pay | Admitting: *Deleted

## 2016-06-21 ENCOUNTER — Emergency Department (HOSPITAL_COMMUNITY): Payer: Self-pay

## 2016-06-21 DIAGNOSIS — Z79899 Other long term (current) drug therapy: Secondary | ICD-10-CM | POA: Insufficient documentation

## 2016-06-21 DIAGNOSIS — I10 Essential (primary) hypertension: Secondary | ICD-10-CM | POA: Insufficient documentation

## 2016-06-21 DIAGNOSIS — F17200 Nicotine dependence, unspecified, uncomplicated: Secondary | ICD-10-CM | POA: Insufficient documentation

## 2016-06-21 DIAGNOSIS — N938 Other specified abnormal uterine and vaginal bleeding: Secondary | ICD-10-CM | POA: Insufficient documentation

## 2016-06-21 LAB — CBC WITH DIFFERENTIAL/PLATELET
Basophils Absolute: 0 10*3/uL (ref 0.0–0.1)
Basophils Relative: 0 %
EOS ABS: 0.1 10*3/uL (ref 0.0–0.7)
Eosinophils Relative: 1 %
HEMATOCRIT: 36.2 % (ref 36.0–46.0)
HEMOGLOBIN: 11.5 g/dL — AB (ref 12.0–15.0)
LYMPHS ABS: 2.6 10*3/uL (ref 0.7–4.0)
LYMPHS PCT: 38 %
MCH: 28.3 pg (ref 26.0–34.0)
MCHC: 31.8 g/dL (ref 30.0–36.0)
MCV: 88.9 fL (ref 78.0–100.0)
MONOS PCT: 6 %
Monocytes Absolute: 0.4 10*3/uL (ref 0.1–1.0)
NEUTROS ABS: 3.6 10*3/uL (ref 1.7–7.7)
NEUTROS PCT: 55 %
Platelets: 369 10*3/uL (ref 150–400)
RBC: 4.07 MIL/uL (ref 3.87–5.11)
RDW: 14.7 % (ref 11.5–15.5)
WBC: 6.7 10*3/uL (ref 4.0–10.5)

## 2016-06-21 LAB — BASIC METABOLIC PANEL
Anion gap: 8 (ref 5–15)
BUN: 13 mg/dL (ref 6–20)
CHLORIDE: 104 mmol/L (ref 101–111)
CO2: 28 mmol/L (ref 22–32)
CREATININE: 0.88 mg/dL (ref 0.44–1.00)
Calcium: 9.3 mg/dL (ref 8.9–10.3)
GFR calc non Af Amer: >60 mL/min (ref >60)
Glucose, Bld: 91 mg/dL (ref 65–99)
POTASSIUM: 3.8 mmol/L (ref 3.5–5.1)
Sodium: 140 mmol/L (ref 135–145)

## 2016-06-21 LAB — PROTIME-INR
INR: 0.96
Prothrombin Time: 12.8 seconds (ref 11.4–15.2)

## 2016-06-21 LAB — I-STAT BETA HCG BLOOD, ED (MC, WL, AP ONLY): I-stat hCG, quantitative: <5 m[IU]/mL (ref <5)

## 2016-06-21 MED ORDER — MEDROXYPROGESTERONE ACETATE 10 MG PO TABS: 10.0000 mg | ORAL_TABLET | Freq: Every day | ORAL | 0 refills | Status: DC

## 2016-06-21 NOTE — ED Provider Notes (Signed)
[facility] DEPT Provider Note   CSN: [id number] Arrival date & time: 06/21/16  0857     History   Chief Complaint Chief Complaint  Patient presents with  . Vaginal Bleeding  . Pelvic Pain    HPI Margaret Hahn is a 30 y.o. female.  Patient is a 30 year old female who presents with complaints of vaginal bleeding for the past two months. She reports daily bleeding and pelvic discomfort. She was seen at an outside clinic and was told everything seemed okay. She presents here today with ongoing bleeding. She denies the possibility of pregnancy. She denies any chest pain, shortness of breath, or dizziness.   The history is provided by the patient.  Vaginal Bleeding  Primary symptoms include pelvic pain, vaginal bleeding. There has been no fever. This is a new problem. Episode onset: 2 months ago. The problem occurs constantly. The problem has been gradually worsening. She is not pregnant. She has not missed her period. Pertinent negatives include no diaphoresis. She has tried nothing for the symptoms. She uses nothing for contraception.  Pelvic Pain     Past Medical History:  Diagnosis Date  . Hypertension     Patient Active Problem List   Diagnosis Date Noted  . PCOS (polycystic ovarian syndrome) 04/25/2013  . Metrorrhagia 04/25/2013  . HTN (hypertension) 04/25/2013    Past Surgical History:  Procedure Laterality Date  . CHOLECYSTECTOMY      OB History    No data available       Home Medications    Prior to Admission medications   Medication Sig Start Date End Date Taking? Authorizing Provider  cetirizine (ZYRTEC) 10 MG tablet Take 10 mg by mouth daily as needed for allergies.   Yes Historical Provider, MD  ibuprofen (ADVIL,MOTRIN) 200 MG tablet Take 600 mg by mouth every 6 (six) hours as needed for headache or moderate pain.   Yes Historical Provider, MD  lisinopril-hydrochlorothiazide (PRINZIDE,ZESTORETIC) 20-25 MG tablet Take 1 tablet by mouth daily.  07/25/15  Yes Historical Provider, MD  Multiple Vitamin (MULTIVITAMIN WITH MINERALS) TABS tablet Take 1 tablet by mouth daily.   Yes Historical Provider, MD  phentermine (ADIPEX-P) 37.5 MG tablet Take 37.5 mg by mouth daily. 06/27/15  Yes Historical Provider, MD    Family History Family History  Problem Relation Age of Onset  . Diabetes Mother     Social History Social History  Substance Use Topics  . Smoking status: Current Every Day Smoker  . Smokeless tobacco: Never Used  . Alcohol use No     Allergies   Patient has no known allergies.   Review of Systems Review of Systems  Constitutional: Negative for diaphoresis.  Genitourinary: Positive for pelvic pain and vaginal bleeding.  All other systems reviewed and are negative.    Physical Exam Updated Vital Signs BP 125/84 (BP Location: Left Arm)   Pulse 79   Temp 98.1 F (36.7 C) (Oral)   Resp 18   Ht 5\' 4"  (1.626 m)   Wt (!) 309 lb (140.2 kg)   SpO2 97%   BMI 53.04 kg/m   Physical Exam  Constitutional: She is oriented to person, place, and time. She appears well-developed and well-nourished. No distress.  HENT:  Head: Normocephalic and atraumatic.  Neck: Normal range of motion. Neck supple.  Cardiovascular: Normal rate and regular rhythm.  Exam reveals no gallop and no friction rub.   No murmur heard. Pulmonary/Chest: Effort normal and breath sounds normal. No respiratory distress.  She has no wheezes.  Abdominal: Soft. Bowel sounds are normal. She exhibits no distension. There is tenderness. There is no rebound and no guarding.  There is mild suprapubic tenderness to palpation.  Musculoskeletal: Normal range of motion.  Neurological: She is alert and oriented to person, place, and time.  Skin: Skin is warm and dry. She is not diaphoretic.  Nursing note and vitals reviewed.    ED Treatments / Results  Labs (all labs ordered are listed, but only abnormal results are displayed) Labs Reviewed  BASIC  METABOLIC PANEL  CBC WITH DIFFERENTIAL/PLATELET  PROTIME-INR  I-STAT BETA HCG BLOOD, ED ([facility], [facility], [facility] ONLY)    EKG  EKG Interpretation None       Radiology No results found.  Procedures Procedures (including critical care time)  Medications Ordered in ED Medications - No data to display   Initial Impression / Assessment and Plan / ED Course  I have reviewed the triage vital signs and the nursing notes.  Pertinent labs & imaging results that were available during my care of the patient were reviewed by me and considered in my medical decision making (see chart for details).  Clinical Course     Patient presents with complaints of vaginal bleeding. Her hemoglobin is 11.5 and ultrasound reveals no obvious intrauterine abnormalities. I'm uncertain as to the exact etiology of this bleeding, however I suspect something hormonal. I will prescribe Provera and have [facility].  Final Clinical Impressions(s) / ED Diagnoses   Final diagnoses:  None    New Prescriptions New Prescriptions   No medications on file     Veryl Speak, MD 06/21/16 1216

## 2016-06-21 NOTE — Discharge Instructions (Signed)
Provera as prescribed.  Call women's outpatient clinic to arrange a follow-up appointment. The contact information for the clinic has been provided in this discharge summary for you to call and make these arrangements.

## 2016-06-21 NOTE — ED Notes (Signed)
Pt returned to room from imaging dept. RN aware.

## 2016-06-21 NOTE — ED Notes (Signed)
Pt denies concerns with dc 

## 2016-06-21 NOTE — ED Triage Notes (Addendum)
Patient came in with c/o vaginal bleeding since September. She states the bleeding is large clots. [facility] The bleeding has been constant. Patient states she goes through 10 pads/day. Patient c/o weakness. Pelvic pain is at a 10 on a scale of 0-10. Nothing makes pain better or worse. Patient is sexually active. Patient did preg test in Oct and it was negative.

## 2016-07-08 ENCOUNTER — Encounter: Payer: Self-pay | Admitting: Obstetrics and Gynecology

## 2016-07-08 ENCOUNTER — Ambulatory Visit (INDEPENDENT_AMBULATORY_CARE_PROVIDER_SITE_OTHER): Payer: Self-pay | Admitting: Family Medicine

## 2016-07-08 ENCOUNTER — Encounter: Payer: Self-pay | Admitting: Family Medicine

## 2016-07-08 VITALS — BP 124/73 | HR 83 | Ht 64.0 in | Wt 323.0 lb

## 2016-07-08 DIAGNOSIS — E282 Polycystic ovarian syndrome: Secondary | ICD-10-CM

## 2016-07-08 DIAGNOSIS — I10 Essential (primary) hypertension: Secondary | ICD-10-CM

## 2016-07-08 DIAGNOSIS — N921 Excessive and frequent menstruation with irregular cycle: Secondary | ICD-10-CM

## 2016-07-08 LAB — TSH: TSH: 1.38 mIU/L

## 2016-07-08 MED ORDER — MEDROXYPROGESTERONE ACETATE 10 MG PO TABS: 10.0000 mg | ORAL_TABLET | Freq: Every day | ORAL | 3 refills | Status: DC

## 2016-07-08 NOTE — Assessment & Plan Note (Signed)
If attempting pregnancy--would need to be off her lisinopril

## 2016-07-08 NOTE — Assessment & Plan Note (Signed)
Lengthy discussion had with pt. to explain PCOS, diagrams and verbal communication used to show impact on ovaries, endometrium, need for endometrial protection, impact on fertility, medical treatments to achieve necessary endpoints.  Should be on cyclical progestin vs. OC's if not trying to get pregnant.  Should be on Femara or clomid if trying to conceive. She will return with payment if wants to pursue fertility.

## 2016-07-08 NOTE — Assessment & Plan Note (Signed)
Probably related to PCOS/anovulatory cycles--monthly Provera.

## 2016-07-08 NOTE — Progress Notes (Signed)
   Subjective:    Patient ID: Margaret Hahn is a 30 y.o. female presenting with Follow-up (for AUB)  on 07/08/2016  HPI: Reports bleeding since September. She was seen in ED and given Provera and bleeding stopped, but after she completed the dose she began bleeding again. Hgb was 11.5. Normal pelvic sono. Menarche was at 52, then no cycle until 54. Then had monthly cycles. At age 41 began having some spacing, up to 1 year and now can be every 3-6 months. Weight has been up and down during this time. Has been on OC's and Megace and she did not take this. Taking ovulatory prediction kits, and no signs of ovulation. Desires pregnancy. Has had normal pap smear in Oct 2017 at [facility].  Review of Systems  Constitutional: Negative for chills and fever.  Respiratory: Negative for shortness of breath.   Cardiovascular: Negative for chest pain.  Gastrointestinal: Negative for abdominal pain, nausea and vomiting.  Genitourinary: Negative for dysuria.  Skin: Negative for rash.      Objective:    BP 124/73   Pulse 83   Ht 5\' 4"  (1.626 m)   Wt (!) 323 lb (146.5 kg)   LMP 03/29/2016 (Within Days)   BMI 55.44 kg/m  Physical Exam  Constitutional: She is oriented to person, place, and time. She appears well-developed and well-nourished. No distress.  HENT:  Head: Normocephalic and atraumatic.  Eyes: No scleral icterus.  Neck: Neck supple.  Cardiovascular: Normal rate.   Pulmonary/Chest: Effort normal.  Abdominal: Soft.  Neurological: She is alert and oriented to person, place, and time.  Skin: Skin is warm and dry.  Psychiatric: She has a normal mood and affect.        Assessment & Plan:   Problem List Items Addressed This Visit      Unprioritized   PCOS (polycystic ovarian syndrome) - Primary    Lengthy discussion had with pt. to explain PCOS, diagrams and verbal communication used to show impact on ovaries, endometrium, need for endometrial protection, impact on  fertility, medical treatments to achieve necessary endpoints.  Should be on cyclical progestin vs. OC's if not trying to get pregnant.  Should be on Femara or clomid if trying to conceive. She will return with payment if wants to pursue fertility.       Metrorrhagia    Probably related to PCOS/anovulatory cycles--monthly Provera.      Relevant Medications   medroxyPROGESTERone (PROVERA) 10 MG tablet   Other Relevant Orders   TSH   HTN (hypertension)    If attempting pregnancy--would need to be off her lisinopril         Total face-to-face time with patient: 20 minutes. Over 50% of encounter was spent on counseling and coordination of care. Return in about 3 months (around 10/06/2016).  Donnamae Jude 07/08/2016 1:16 PM

## 2016-07-08 NOTE — Patient Instructions (Signed)
Abnormal Uterine Bleeding Abnormal uterine bleeding means bleeding from the vagina that is not your normal menstrual period. This can be:  Bleeding or spotting between periods.  Bleeding after sex (sexual intercourse).  Bleeding that is heavier or more than normal.  Periods that last longer than usual.  Bleeding after menopause. There are many problems that may cause this. Treatment will depend on the cause of the bleeding. Any kind of bleeding that is not normal should be reviewed by your doctor. Follow these instructions at home: Watch your condition for any changes. These actions may lessen any discomfort you are having:  Do not use tampons or douches as told by your doctor.  Change your pads often. You should get regular pelvic exams and Pap tests. Keep all appointments for tests as told by your doctor. Contact a doctor if:  You are bleeding for more than 1 week.  You feel dizzy at times. Get help right away if:  You pass out.  You have to change pads every 15 to 30 minutes.  You have belly pain.  You have a fever.  You become sweaty or weak.  You are passing large blood clots from the vagina.  You feel sick to your stomach (nauseous) and throw up (vomit). This information is not intended to replace advice given to you by your health care provider. Make sure you discuss any questions you have with your health care provider. Document Released: 04/25/2009 Document Revised: 12/04/2015 Document Reviewed: 01/25/2013 Elsevier Interactive Patient Education  2017 [employer].

## 2017-12-08 ENCOUNTER — Encounter (HOSPITAL_COMMUNITY): Payer: Self-pay | Admitting: *Deleted

## 2017-12-08 ENCOUNTER — Emergency Department (HOSPITAL_COMMUNITY)
Admission: EM | Admit: 2017-12-08 | Discharge: 2017-12-08 | Disposition: A | Discharge status: Home / Self Care | Payer: Self-pay | Attending: Physician Assistant | Admitting: Physician Assistant

## 2017-12-08 DIAGNOSIS — Y929 Unspecified place or not applicable: Secondary | ICD-10-CM | POA: Insufficient documentation

## 2017-12-08 DIAGNOSIS — Y9389 Activity, other specified: Secondary | ICD-10-CM | POA: Insufficient documentation

## 2017-12-08 DIAGNOSIS — Y998 Other external cause status: Secondary | ICD-10-CM | POA: Insufficient documentation

## 2017-12-08 DIAGNOSIS — I10 Essential (primary) hypertension: Secondary | ICD-10-CM | POA: Insufficient documentation

## 2017-12-08 DIAGNOSIS — S76912A Strain of unspecified muscles, fascia and tendons at thigh level, left thigh, initial encounter: Secondary | ICD-10-CM | POA: Insufficient documentation

## 2017-12-08 DIAGNOSIS — X58XXXA Exposure to other specified factors, initial encounter: Secondary | ICD-10-CM | POA: Insufficient documentation

## 2017-12-08 HISTORY — DX: Essential (primary) hypertension: I10

## 2017-12-08 MED ORDER — METHOCARBAMOL 500 MG PO TABS: 500.0000 mg | ORAL_TABLET | Freq: Two times a day (BID) | ORAL | 0 refills | Status: DC

## 2017-12-08 NOTE — ED Triage Notes (Signed)
Pt in c/o lower back pain, and pain in her left hip that is worse with position change for the last few days, pain improves with ambulation

## 2017-12-08 NOTE — ED Provider Notes (Signed)
[facility] EMERGENCY DEPARTMENT Provider Note   CSN: [id number] Arrival date & time: 12/08/17  1727     History   Chief Complaint Chief Complaint  Patient presents with  . Back Pain    HPI Margaret Hahn is a 32 y.o. female with a h/o of obesity and HTN who presents to the emergency department with a chief complaint of left thigh pain.   The patient endorses sudden onset, gradually worsening left thigh pain that began 6 days ago when she awoke.  No new exercises or trauma, but the patient does report that she had a vigorous sex the night before the pain began.  She reports that the pain begins about mid thigh and radiates upward toward the left hip when she goes from sitting to standing.  Pain is worse with positional changes and improved with ambulation.  She denies numbness, weakness, left hip, left knee, or left ankle pain.  No right lower extremity pain.  No history of previous surgery or injury.  She has been treating her symptoms with her home ibuprofen with relief for several hours until the medication wears off.  The history is provided by the patient. No language interpreter was used.  Back Pain   Pertinent negatives include no chest pain, no fever, no numbness, no abdominal pain and no weakness.    Past Medical History:  Diagnosis Date  . Hypertension     There are no active problems to display for this patient.   History reviewed. No pertinent surgical history.   OB History   None      Home Medications    Prior to Admission medications   Medication Sig Start Date End Date Taking? Authorizing Provider  methocarbamol (ROBAXIN) 500 MG tablet Take 1 tablet (500 mg total) by mouth 2 (two) times daily. 12/08/17   McDonald, Mia A, PA-C    Family History History reviewed. No pertinent family history.  Social History Social History   Tobacco Use  . Smoking status: Not on file  . Smokeless tobacco: Never Used  Substance Use Topics  . Alcohol  use: Not on file  . Drug use: Yes    Types: Marijuana     Allergies   Patient has no known allergies.   Review of Systems Review of Systems  Constitutional: Negative for activity change, chills and fever.  Respiratory: Negative for shortness of breath.   Cardiovascular: Negative for chest pain.  Gastrointestinal: Negative for abdominal pain.  Musculoskeletal: Positive for myalgias. Negative for arthralgias, back pain, gait problem, joint swelling, neck pain and neck stiffness.  Skin: Negative for rash.  Allergic/Immunologic: Negative for immunocompromised state.  Neurological: Negative for weakness and numbness.   Physical Exam Updated Vital Signs BP (!) 162/107   Pulse 90   Temp 98.8 F (37.1 C) (Oral)   Resp 17   LMP 11/19/2017   SpO2 98%   Physical Exam  Constitutional: No distress.  HENT:  Head: Normocephalic.  Eyes: Conjunctivae are normal.  Neck: Neck supple.  Cardiovascular: Normal rate and regular rhythm. Exam reveals no gallop and no friction rub.  No murmur heard. Pulmonary/Chest: Effort normal. No respiratory distress.  Abdominal: Soft. She exhibits no distension.  Musculoskeletal: Normal range of motion. She exhibits tenderness. She exhibits no edema or deformity.  Tender to palpation over the lateral aspect of the left thigh, beginning at the mid thigh and extending to the proximal thigh.  No overlying rash, erythema, or edema.  Full active and passive  range of motion of the left hip, knee, and ankle.  No tenderness over the joints of the left lower extremity.  No tenderness to the cervical, thoracic, or lumbar spinous processes or bilateral paraspinal muscles.  DP and PT pulses are 2+ and symmetric.  Sensation is intact and symmetric throughout the bilateral lower extremities.  5 out of 5 strength against resistance with dorsiflexion and plantarflexion.  Neurological: She is alert.  Skin: Skin is warm. No rash noted.  Psychiatric: Her behavior is normal.   Nursing note and vitals reviewed.  ED Treatments / Results  Labs (all labs ordered are listed, but only abnormal results are displayed) Labs Reviewed - No data to display  EKG None  Radiology No results found.  Procedures Procedures (including critical care time)  Medications Ordered in ED Medications - No data to display   Initial Impression / Assessment and Plan / ED Course  I have reviewed the triage vital signs and the nursing notes.  Pertinent labs & imaging results that were available during my care of the patient were reviewed by me and considered in my medical decision making (see chart for details).     32 year old with a h/o of obesity and HTN who presents to the Emergency department with a chief complaint of left mid thigh pain.  On exam, she has reproducible tenderness to the lateral aspect of the left mid thigh. Suspect hib ABductor strain.  She is neurovascularly intact.  Blood pressure is elevated to 186/107; she has no signs or symptoms of endorgan disease.  She has not taken her evening dose of antihypertensives, but plans to take them as soon as she is discharged from the emergency department.  Will discharge the patient to home with supportive care and follow-up to her PCP if no improvement in her symptoms.  Doubt femur fracture, hip dislocation, or DVT.  Strict return precautions given.  She is hemodynamically stable and safe for discharge home at this time.  Final Clinical Impressions(s) / ED Diagnoses   Final diagnoses:  Muscle strain of left thigh, initial encounter    ED Discharge Orders        Ordered    methocarbamol (ROBAXIN) 500 MG tablet  2 times daily     12/08/17 2114       Joline Maxcy A, PA-C 12/08/17 2129    Macarthur Critchley, MD 12/09/17 0011

## 2017-12-08 NOTE — ED Notes (Signed)
See provider assessment 

## 2017-12-08 NOTE — Discharge Instructions (Signed)
Thank you for allowing me to provide your care today in the emergency department.  Please make sure you take your nighttime dose of your blood pressure medication when you get home.  Continue to take your home 800 mg of ibuprofen.  You can take 1 dose every 8 hours with food.  You can also supplement your pain medicine by taking thousand milligrams of Tylenol every 8 hours or you can alternate between these 2 medications every 4 hours.  Take 1 tablet of Robaxin up to 2 times daily.  This is a muscle relaxer may help with muscle pain and spasms.  Apply ice or heat for 15 to 20 minutes up to 3-4 times a day.  Start to stretch as your pain allows.  I have attached some stretches that should help with your pain.  If your pain does not start to improve in the next week, please follow-up with your primary care provider.  Return to the emergency department if you develop new weakness or numbness in the left leg.

## 2017-12-08 NOTE — ED Notes (Signed)
Pt verbalizes understanding of d/c instructions. Pt received prescriptions. Pt ambulatory at d/c with all belongings.  

## 2019-09-19 ENCOUNTER — Other Ambulatory Visit: Payer: Self-pay

## 2019-09-19 ENCOUNTER — Emergency Department (HOSPITAL_COMMUNITY): Payer: No Typology Code available for payment source

## 2019-09-19 ENCOUNTER — Encounter (HOSPITAL_COMMUNITY): Payer: Self-pay

## 2019-09-19 ENCOUNTER — Emergency Department (HOSPITAL_COMMUNITY)
Admission: EM | Admit: 2019-09-19 | Discharge: 2019-09-20 | Disposition: A | Discharge status: Home / Self Care | Payer: No Typology Code available for payment source | Attending: Emergency Medicine | Admitting: Emergency Medicine

## 2019-09-19 DIAGNOSIS — Y93I9 Activity, other involving external motion: Secondary | ICD-10-CM | POA: Insufficient documentation

## 2019-09-19 DIAGNOSIS — I10 Essential (primary) hypertension: Secondary | ICD-10-CM | POA: Insufficient documentation

## 2019-09-19 DIAGNOSIS — Y92410 Unspecified street and highway as the place of occurrence of the external cause: Secondary | ICD-10-CM | POA: Diagnosis not present

## 2019-09-19 DIAGNOSIS — M546 Pain in thoracic spine: Secondary | ICD-10-CM | POA: Insufficient documentation

## 2019-09-19 DIAGNOSIS — Y999 Unspecified external cause status: Secondary | ICD-10-CM | POA: Insufficient documentation

## 2019-09-19 DIAGNOSIS — Z79899 Other long term (current) drug therapy: Secondary | ICD-10-CM | POA: Insufficient documentation

## 2019-09-19 MED ORDER — ACETAMINOPHEN 500 MG PO TABS
1000.0000 mg | ORAL_TABLET | Freq: Once | ORAL | Status: AC
  Administered 2019-09-20: 1000 mg via ORAL
  Filled 2019-09-19: qty 2
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
## 2019-09-20 MED ORDER — METHOCARBAMOL 500 MG PO TABS: 500.0000 mg | ORAL_TABLET | Freq: Two times a day (BID) | ORAL | 0 refills | Status: DC

## 2019-09-20 MED ORDER — DICLOFENAC SODIUM ER 100 MG PO TB24: 100.0000 mg | ORAL_TABLET | Freq: Every day | ORAL | 0 refills | Status: DC
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
## 2019-09-27 ENCOUNTER — Encounter: Payer: Self-pay | Admitting: Family Medicine

## 2020-02-15 ENCOUNTER — Encounter (HOSPITAL_COMMUNITY): Payer: Self-pay

## 2020-02-15 ENCOUNTER — Emergency Department (HOSPITAL_COMMUNITY)
Admission: EM | Admit: 2020-02-15 | Discharge: 2020-02-15 | Disposition: A | Discharge status: Home / Self Care | Payer: Self-pay | Attending: Emergency Medicine | Admitting: Emergency Medicine

## 2020-02-15 ENCOUNTER — Other Ambulatory Visit: Payer: Self-pay

## 2020-02-15 DIAGNOSIS — Z5321 Procedure and treatment not carried out due to patient leaving prior to being seen by health care provider: Secondary | ICD-10-CM | POA: Insufficient documentation

## 2020-02-15 DIAGNOSIS — N939 Abnormal uterine and vaginal bleeding, unspecified: Secondary | ICD-10-CM | POA: Insufficient documentation

## 2020-02-15 NOTE — ED Notes (Signed)
Patient was called for vital sign recheck but no response x1.

## 2020-02-15 NOTE — ED Notes (Signed)
PT called for lab work, no response x1 in lobby.

## 2020-02-15 NOTE — ED Notes (Signed)
Patient was called for vital sign recheck but no response.  

## 2020-02-15 NOTE — ED Triage Notes (Signed)
Patient states she has had a period for 2 months. Patient states she had this issue last year and was given medication for the excessive vaginal bleeding.

## 2020-03-27 ENCOUNTER — Ambulatory Visit
Admission: EM | Admit: 2020-03-27 | Discharge: 2020-03-27 | Disposition: A | Discharge status: Home / Self Care | Payer: BC Managed Care – PPO | Attending: Emergency Medicine | Admitting: Emergency Medicine

## 2020-03-27 DIAGNOSIS — R300 Dysuria: Secondary | ICD-10-CM | POA: Diagnosis not present

## 2020-03-27 LAB — POCT URINALYSIS DIP (MANUAL ENTRY)
Bilirubin, UA: NEGATIVE
Blood, UA: NEGATIVE
Glucose, UA: NEGATIVE mg/dL
Ketones, POC UA: NEGATIVE mg/dL
Leukocytes, UA: NEGATIVE
Nitrite, UA: NEGATIVE
Protein Ur, POC: NEGATIVE mg/dL
Spec Grav, UA: >=1.03 — AB (ref 1.010–1.025)
Urobilinogen, UA: 0.2 E.U./dL
pH, UA: 6.5 (ref 5.0–8.0)

## 2020-03-27 LAB — POCT URINE PREGNANCY: Preg Test, Ur: NEGATIVE

## 2020-03-27 NOTE — ED Triage Notes (Signed)
Pt c/o urinary frequency with vaginal discomfort when sitting since Saturday. Denies vaginal discharge. States has had un protected intercourse.

## 2020-03-27 NOTE — Discharge Instructions (Addendum)

## 2020-03-27 NOTE — ED Provider Notes (Signed)
[facility]    CSN: [id number] Arrival date & time: 03/27/20  1726      History   Chief Complaint Chief Complaint  Patient presents with  . Urinary Tract Infection    HPI Margaret Hahn is a 34 y.o. female  Presenting for possible UTI.  Endorsing urinary frequency with vaginal discomfort since Saturday.  Patient denies discharge, bleeding, pelvic pain.  Did recently have unprotected intercourse, prolonged hot tub exposure.  Denies rough intercourse, dyspareunia.  No abdominal pain, nausea, vomiting, back pain or fevers.  Has not taken thing for this.  Requesting STI testing.  Past Medical History:  Diagnosis Date  . Hypertension     Patient Active Problem List   Diagnosis Date Noted  . PCOS (polycystic ovarian syndrome) 04/25/2013  . Metrorrhagia 04/25/2013  . HTN (hypertension) 04/25/2013    Past Surgical History:  Procedure Laterality Date  . CHOLECYSTECTOMY      OB History   No obstetric history on file.      Home Medications    Prior to Admission medications   Medication Sig Start Date End Date Taking? Authorizing Provider  cetirizine (ZYRTEC) 10 MG tablet Take 10 mg by mouth daily as needed for allergies.    [provider]  Diclofenac Sodium CR 100 MG 24 hr tablet Take 1 tablet (100 mg total) by mouth daily. 09/20/19   Palumbo, April, MD  ibuprofen (ADVIL,MOTRIN) 200 MG tablet Take 600 mg by mouth every 6 (six) hours as needed for headache or moderate pain.    [provider]  lisinopril-hydrochlorothiazide (PRINZIDE,ZESTORETIC) 20-25 MG tablet Take 1 tablet by mouth daily. 07/25/15   [provider]  methocarbamol (ROBAXIN) 500 MG tablet Take 1 tablet (500 mg total) by mouth 2 (two) times daily. 09/20/19   Palumbo, April, MD    Family History Family History  Problem Relation Age of Onset  . Diabetes Mother     Social History Social History   Tobacco Use  . Smoking status: Never Smoker  . Smokeless tobacco: Never  Used  Vaping Use  . Vaping Use: Never used  Substance Use Topics  . Alcohol use: No  . Drug use: Yes    Types: Marijuana    Comment: once a day     Allergies   Patient has no known allergies.   Review of Systems As per HPI   Physical Exam Triage Vital Signs ED Triage Vitals  Enc Vitals Group     BP      Pulse      Resp      Temp      Temp src      SpO2      Weight      Height      Head Circumference      Peak Flow      Pain Score      Pain Loc      Pain Edu?      Excl. in [place]?    No data found.  Updated Vital Signs BP 113/78 (BP Location: Left Arm)   Pulse 91   Temp 98.1 F (36.7 C) (Oral)   Resp 14   LMP 03/05/2020   SpO2 97%   Visual Acuity Right Eye Distance:   Left Eye Distance:   Bilateral Distance:    Right Eye Near:   Left Eye Near:    Bilateral Near:     Physical Exam Constitutional:      General: She is  not in acute distress. HENT:     Head: Normocephalic and atraumatic.  Eyes:     General: No scleral icterus.    Pupils: Pupils are equal, round, and reactive to light.  Cardiovascular:     Rate and Rhythm: Normal rate.  Pulmonary:     Effort: Pulmonary effort is normal.  Abdominal:     General: Bowel sounds are normal.     Palpations: Abdomen is soft.     Tenderness: There is no abdominal tenderness. There is no right CVA tenderness, left CVA tenderness or guarding.  Genitourinary:    Comments: Patient declined, self-swab performed Skin:    Coloration: Skin is not jaundiced or pale.  Neurological:     Mental Status: She is alert and oriented to person, place, and time.      UC Treatments / Results  Labs (all labs ordered are listed, but only abnormal results are displayed) Labs Reviewed  POCT URINALYSIS DIP (MANUAL ENTRY) - Abnormal; Notable for the following components:      Result Value   Spec Grav, UA >=1.030 (*)    All other components within normal limits  POCT URINE PREGNANCY  CERVICOVAGINAL ANCILLARY ONLY     EKG   Radiology No results found.  Procedures Procedures (including critical care time)  Medications Ordered in UC Medications - No data to display  Initial Impression / Assessment and Plan / UC Course  I have reviewed the triage vital signs and the nursing notes.  Pertinent labs & imaging results that were available during my care of the patient were reviewed by me and considered in my medical decision making (see chart for details).     Patient appears well in office.  Urine pregnancy negative, urine unremarkable.  Likely vaginitis second to intercourse VS prolonged water exposure.  Will treat supportively as outlined below.  Cytology pending: We will treat for staph indicated.  Return precautions discussed, pt verbalized understanding and is agreeable to plan. Final Clinical Impressions(s) / UC Diagnoses   Final diagnoses:  Dysuria     Discharge Instructions     Testing for chlamydia, gonorrhea, trichomonas is pending: please look for these results on the MyChart app/website.  We will notify you if you are positive and outline treatment at that time.  Important to avoid all forms of sexual intercourse (oral, vaginal, anal) with any/all partners for the next 7 days to avoid spreading/reinfecting. Any/all sexual partners should be notified of testing/treatment today.  Return for persistent/worsening symptoms or if you develop fever, abdominal or pelvic pain, discharge, genital pain, blood in your urine, or are re-exposed to an STI.    ED Prescriptions    None     PDMP not reviewed this encounter.   Hall-Potvin, [place], [place] 03/27/20 1823

## 2020-03-31 LAB — CERVICOVAGINAL ANCILLARY ONLY
Chlamydia: NEGATIVE
Comment: NEGATIVE
Comment: NEGATIVE
Comment: NORMAL
Neisseria Gonorrhea: NEGATIVE
Trichomonas: NEGATIVE

## 2020-10-13 ENCOUNTER — Encounter: Payer: Self-pay | Admitting: *Deleted

## 2020-10-13 ENCOUNTER — Other Ambulatory Visit: Payer: Self-pay

## 2020-10-13 ENCOUNTER — Ambulatory Visit
Admission: EM | Admit: 2020-10-13 | Discharge: 2020-10-13 | Disposition: A | Discharge status: Home / Self Care | Payer: BC Managed Care – PPO | Attending: Emergency Medicine | Admitting: Emergency Medicine

## 2020-10-13 DIAGNOSIS — Z113 Encounter for screening for infections with a predominantly sexual mode of transmission: Secondary | ICD-10-CM | POA: Diagnosis not present

## 2020-10-13 DIAGNOSIS — Z79899 Other long term (current) drug therapy: Secondary | ICD-10-CM | POA: Insufficient documentation

## 2020-10-13 DIAGNOSIS — N3001 Acute cystitis with hematuria: Secondary | ICD-10-CM | POA: Insufficient documentation

## 2020-10-13 DIAGNOSIS — R81 Glycosuria: Secondary | ICD-10-CM | POA: Diagnosis not present

## 2020-10-13 LAB — POCT URINALYSIS DIP (MANUAL ENTRY)
Bilirubin, UA: NEGATIVE
Glucose, UA: 100 mg/dL — AB
Ketones, POC UA: NEGATIVE mg/dL
Leukocytes, UA: NEGATIVE
Nitrite, UA: POSITIVE — AB
Protein Ur, POC: NEGATIVE mg/dL
Spec Grav, UA: >=1.03 — AB (ref 1.010–1.025)
Urobilinogen, UA: 1 E.U./dL
pH, UA: 7 (ref 5.0–8.0)

## 2020-10-13 LAB — POCT URINE PREGNANCY: Preg Test, Ur: NEGATIVE

## 2020-10-13 MED ORDER — IBUPROFEN 600 MG PO TABS
600.0000 mg | ORAL_TABLET | Freq: Four times a day (QID) | ORAL | 0 refills | Status: DC | PRN
Start: 2020-10-13 — End: 2021-11-06

## 2020-10-13 MED ORDER — NITROFURANTOIN MONOHYD MACRO 100 MG PO CAPS
100.0000 mg | ORAL_CAPSULE | Freq: Two times a day (BID) | ORAL | 0 refills | Status: AC
Start: 2020-10-13 — End: 2020-10-18

## 2020-10-13 MED ORDER — PHENAZOPYRIDINE HCL 200 MG PO TABS
200.0000 mg | ORAL_TABLET | Freq: Three times a day (TID) | ORAL | 0 refills | Status: DC | PRN
Start: 2020-10-13 — End: 2021-11-04

## 2020-10-13 NOTE — Discharge Instructions (Addendum)
Finish the [employer], even if you feel better.  Pyridium will help with your urinary symptoms.  600 mg of ibuprofen combined with 1000 mg of Tylenol together 3 or 4 times a day as needed for pain.  Your urine culture will be back in several days.  We will contact you if we need to change your antibiotics.  We will also contact you if any of your other labs come back abnormal.

## 2020-10-13 NOTE — ED Provider Notes (Signed)
HPI  SUBJECTIVE:  Margaret Hahn is a 35 y.o. female who presents with 2 days of dysuria, urgency, cloudy odorous urine.  She reports low midline crampy abdominal/pelvic pain starting today.  No odorous urine, hematuria, nausea, vomiting, fevers, other abdominal pain, back pain, vaginal odor, bleeding, discharge.  She took a home UTI test and it came back positive.  She is sexually active with 2 partners, one female, 1 female.  She is in a monogamous relationship with the female, unsure about the female.  Thinks that both of them are asymptomatic.  States that last intercourse with her female partner was last year.  She tried Uristat and ibuprofen with improvement in her symptoms.  Symptoms are worse with urinating.  She was on amoxicillin for a dental infection 2 weeks ago.  States that she did not finish it.  She discontinued it over 10 days ago.  She took ibuprofen within 6 hours of arrival.  She has a past medical history of vaginal yeast infections, prediabetes.  No history of UTI, pyelonephritis, nephrolithiasis, gonorrhea, chlamydia, herpes, HIV, syphilis, trichomonas, BV.  LMP: 3/28.  Denies possibility being pregnant.  [facility].    Past Medical History:  Diagnosis Date  . Hypertension     Past Surgical History:  Procedure Laterality Date  . CHOLECYSTECTOMY      Family History  Problem Relation Age of Onset  . Diabetes Mother     Social History   Tobacco Use  . Smoking status: Never Smoker  . Smokeless tobacco: Never Used  Vaping Use  . Vaping Use: Never used  Substance Use Topics  . Alcohol use: No  . Drug use: Yes    Types: Marijuana    Comment: once a day    No current facility-administered medications for this encounter.  Current Outpatient Medications:  .  ibuprofen (ADVIL) 600 MG tablet, Take 1 tablet (600 mg total) by mouth every 6 (six) hours as needed., Disp: 30 tablet, Rfl: 0 .  nitrofurantoin, macrocrystal-monohydrate, (MACROBID) 100 MG capsule, Take  1 capsule (100 mg total) by mouth 2 (two) times daily for 5 days., Disp: 10 capsule, Rfl: 0 .  phenazopyridine (PYRIDIUM) 200 MG tablet, Take 1 tablet (200 mg total) by mouth 3 (three) times daily as needed for pain., Disp: 6 tablet, Rfl: 0 .  cetirizine (ZYRTEC) 10 MG tablet, Take 10 mg by mouth daily as needed for allergies., Disp: , Rfl:  .  lisinopril-hydrochlorothiazide (PRINZIDE,ZESTORETIC) 20-25 MG tablet, Take 1 tablet by mouth daily., Disp: , Rfl: 3  No Known Allergies   ROS  As noted in HPI.   Physical Exam  BP 107/72 (BP Location: Left Arm)   Pulse 62   Temp 98.6 F (37 C) (Oral)   Resp 18   LMP 10/06/2020   SpO2 98%   Constitutional: Well developed, well nourished, no acute distress Eyes:  EOMI, conjunctiva normal bilaterally HENT: Normocephalic, atraumatic,mucus membranes moist Respiratory: Normal inspiratory effort Cardiovascular: Normal rate GI: nondistended soft, nontender.  No suprapubic, flank tenderness. Back: No CVAT skin: No rash, skin intact Musculoskeletal: no deformities Neurologic: Alert & oriented x 3, no focal neuro deficits Psychiatric: Speech and behavior appropriate   ED Course   Medications - No data to display  Orders Placed This Encounter  Procedures  . Urine Culture    Standing Status:   Standing    Number of Occurrences:   1    Order Specific Question:   List patient's active antibiotics  Answer:   macrobid  . POCT urinalysis dipstick    Standing Status:   Standing    Number of Occurrences:   1  . POCT urine pregnancy    Standing Status:   Standing    Number of Occurrences:   1    Results for orders placed or performed during the hospital encounter of 10/13/20 (from the past 24 hour(s))  POCT urinalysis dipstick     Status: Abnormal   Collection Time: 10/13/20  7:36 PM  Result Value Ref Range   Color, UA orange (A) yellow   Clarity, UA clear clear   Glucose, UA =100 (A) negative mg/dL   Bilirubin, UA negative negative    Ketones, POC UA negative negative mg/dL   Spec Grav, UA >=1.030 (A) 1.010 - 1.025   Blood, UA trace-intact (A) negative   pH, UA 7.0 5.0 - 8.0   Protein Ur, POC negative negative mg/dL   Urobilinogen, UA 1.0 0.2 or 1.0 E.U./dL   Nitrite, UA Positive (A) Negative   Leukocytes, UA Negative Negative  POCT urine pregnancy     Status: None   Collection Time: 10/13/20  7:36 PM  Result Value Ref Range   Preg Test, Ur Negative Negative   No results found.  ED Clinical Impression  1. Acute cystitis with hematuria      ED Assessment/Plan  Presentation consistent with a UTI.  Will send home with Macrobid, Pyridium, push fluids.  Will send urine off for culture to confirm diagnosis and antibiotic choice.  We discussed treating as a UTI and see how she does and if she does not get any better, or we could do further testing to rule out gynecologic causes of her symptoms, but she has opted to do testing for STDs, BV and yeast tonight.  Swab sent.  Glucosuria noted.  She will follow up with her PMD to have UA repeated when she is feeling better to rule out diabetes  Discussed labs, MDM, treatment plan, and plan for follow-up with patient. Discussed sn/sx that should prompt return to the ED. patient agrees with plan.   Meds ordered this encounter  Medications  . phenazopyridine (PYRIDIUM) 200 MG tablet    Sig: Take 1 tablet (200 mg total) by mouth 3 (three) times daily as needed for pain.    Dispense:  6 tablet    Refill:  0  . nitrofurantoin, macrocrystal-monohydrate, (MACROBID) 100 MG capsule    Sig: Take 1 capsule (100 mg total) by mouth 2 (two) times daily for 5 days.    Dispense:  10 capsule    Refill:  0  . ibuprofen (ADVIL) 600 MG tablet    Sig: Take 1 tablet (600 mg total) by mouth every 6 (six) hours as needed.    Dispense:  30 tablet    Refill:  0      *This clinic note was created using L[employer]. Therefore, there may be occasional mistakes despite careful  proofreading.  ?    Melynda Ripple, MD 10/14/20 [phone]

## 2020-10-13 NOTE — ED Triage Notes (Signed)
Pt reports she did a UTI home test that was positive . Pt took URISTATand Pt reports her urine was orange.

## 2020-10-15 ENCOUNTER — Telehealth (HOSPITAL_COMMUNITY): Payer: Self-pay | Admitting: Emergency Medicine

## 2020-10-15 LAB — CERVICOVAGINAL ANCILLARY ONLY
Bacterial Vaginitis (gardnerella): POSITIVE — AB
Candida Glabrata: NEGATIVE
Candida Vaginitis: NEGATIVE
Chlamydia: NEGATIVE
Comment: NEGATIVE
Comment: NEGATIVE
Comment: NEGATIVE
Comment: NEGATIVE
Comment: NEGATIVE
Comment: NORMAL
Neisseria Gonorrhea: NEGATIVE
Trichomonas: NEGATIVE

## 2020-10-15 MED ORDER — METRONIDAZOLE 500 MG PO TABS: 500.0000 mg | ORAL_TABLET | Freq: Two times a day (BID) | ORAL | 0 refills | Status: DC

## 2020-10-16 ENCOUNTER — Telehealth: Payer: Self-pay

## 2020-10-16 LAB — URINE CULTURE

## 2020-10-16 MED ORDER — ONDANSETRON 4 MG PO TBDP
4.0000 mg | ORAL_TABLET | Freq: Three times a day (TID) | ORAL | 0 refills | Status: DC | PRN
Start: 2020-10-16 — End: 2021-11-04

## 2020-10-16 MED ORDER — METRONIDAZOLE 0.75 % VA GEL: 1.0000 | Freq: Every day | VAGINAL | 0 refills | Status: AC

## 2020-10-19 MED ORDER — FLUCONAZOLE 150 MG PO TABS: 150.0000 mg | ORAL_TABLET | Freq: Every day | ORAL | 0 refills | Status: DC

## 2020-10-27 ENCOUNTER — Emergency Department (HOSPITAL_COMMUNITY)
Admission: EM | Admit: 2020-10-27 | Discharge: 2020-10-27 | Disposition: A | Discharge status: Home / Self Care | Payer: BC Managed Care – PPO | Attending: Emergency Medicine | Admitting: Emergency Medicine

## 2020-10-27 ENCOUNTER — Encounter (HOSPITAL_COMMUNITY): Payer: Self-pay

## 2020-10-27 ENCOUNTER — Other Ambulatory Visit: Payer: Self-pay

## 2020-10-27 DIAGNOSIS — Z79899 Other long term (current) drug therapy: Secondary | ICD-10-CM | POA: Insufficient documentation

## 2020-10-27 DIAGNOSIS — N898 Other specified noninflammatory disorders of vagina: Secondary | ICD-10-CM

## 2020-10-27 DIAGNOSIS — T7840XA Allergy, unspecified, initial encounter: Secondary | ICD-10-CM

## 2020-10-27 DIAGNOSIS — N7689 Other specified inflammation of vagina and vulva: Secondary | ICD-10-CM | POA: Diagnosis present

## 2020-10-27 DIAGNOSIS — I10 Essential (primary) hypertension: Secondary | ICD-10-CM | POA: Insufficient documentation

## 2020-10-27 LAB — URINALYSIS, ROUTINE W REFLEX MICROSCOPIC
Bilirubin Urine: NEGATIVE
Glucose, UA: NEGATIVE mg/dL
Ketones, ur: NEGATIVE mg/dL
Leukocytes,Ua: NEGATIVE
Nitrite: NEGATIVE
Protein, ur: NEGATIVE mg/dL
Specific Gravity, Urine: 1.016 (ref 1.005–1.030)
pH: 8 (ref 5.0–8.0)

## 2020-10-27 LAB — WET PREP, GENITAL
Clue Cells Wet Prep HPF POC: NONE SEEN
Sperm: NONE SEEN
Trich, Wet Prep: NONE SEEN
Yeast Wet Prep HPF POC: NONE SEEN

## 2020-10-27 LAB — PREGNANCY, URINE: Preg Test, Ur: NEGATIVE

## 2020-10-27 NOTE — Discharge Instructions (Addendum)
Your vaginal irritation is likely from an allergic reaction to both the flagyl oral tablets and flagyl cream that you have taken recently. I would recommend Benadryl daily for the next week and cold compresses to the area to help with symptomatic relief.   Follow up with [facility] for further evaluation.   Return to the ED for any worsening symptoms

## 2020-10-27 NOTE — ED Triage Notes (Addendum)
Patient states she was seen and diagnosed with BV and a UTI on 10/13/20. Patient was prescribed antibiotics. (flagyl and Macrobid, but had a reaction to the Flagyl po and then was prescribed Gel and had 2 doses of Diflucan).  Patient states she has been having vaginal discomfort and burning x 3 days.

## 2020-10-27 NOTE — ED Notes (Signed)
An After Visit Summary was printed and given to the patient. Discharge instructions given and no further questions at this time.  

## 2020-10-27 NOTE — ED Provider Notes (Signed)
[facility] DEPT Provider Note   CSN: [id number] Arrival date & time: 10/27/20  1517     History Chief Complaint  Patient presents with  . Vaginal Pain    Margaret Hahn is a 35 y.o. female with PMHx HTN who presents to the ED today with complaint of vaginal irritation for the past 2 weeks.  She reports she went to an urgent care with her irritation started.  She was diagnosed with a UTI as well as bacterial vaginosis at that time and prescribed both Macrobid and Flagyl.  She states that after taking the Flagyl for 1 time she began feeling shaky and ill.  She states she assumed she was having allergic reaction and called the urgent care and they changed her medication to Flagyl gel.  She states that since taking the Flagyl gel she had swelling to her vaginal area with increased irritation.  She takes Zyrtec daily for allergies by themselves and has been applying ice and feels like the swelling has gone down however still continues to have irritation.  She states when she urinates she has a burning sensation and then when she sits down most of the time she will have irritation.  She states that she is tired of the symptoms at this time and just wants to feel better.  She does report that she had STI testing while in urgent care and has not had intercourse since then and does not want to have repeat STI testing at this time.  Normal menstrual period was 03/25.  No other complaints at this time.  The history is provided by the patient and medical records.       Past Medical History:  Diagnosis Date  . Hypertension     Patient Active Problem List   Diagnosis Date Noted  . PCOS (polycystic ovarian syndrome) 04/25/2013  . Metrorrhagia 04/25/2013  . HTN (hypertension) 04/25/2013    Past Surgical History:  Procedure Laterality Date  . CHOLECYSTECTOMY       OB History   No obstetric history on file.     Family History  Problem Relation Age of Onset  .  Diabetes Mother     Social History   Tobacco Use  . Smoking status: Never Smoker  . Smokeless tobacco: Never Used  Vaping Use  . Vaping Use: Never used  Substance Use Topics  . Alcohol use: No  . Drug use: Yes    Types: Marijuana    Comment: once a day    Home Medications Prior to Admission medications   Medication Sig Start Date End Date Taking? Authorizing Provider  cetirizine (ZYRTEC) 10 MG tablet Take 10 mg by mouth daily as needed for allergies.    [provider]  fluconazole (DIFLUCAN) 150 MG tablet Take 1 tablet (150 mg total) by mouth daily. If no relief after 3 days , take second pill 10/19/20   Wieters, Hallie C, PA-C  ibuprofen (ADVIL) 600 MG tablet Take 1 tablet (600 mg total) by mouth every 6 (six) hours as needed. 10/13/20   Melynda Ripple, MD  lisinopril-hydrochlorothiazide (PRINZIDE,ZESTORETIC) 20-25 MG tablet Take 1 tablet by mouth daily. 07/25/15   [provider]  metroNIDAZOLE (FLAGYL) 500 MG tablet Take 1 tablet (500 mg total) by mouth 2 (two) times daily. 10/15/20   Chase Picket, MD  ondansetron (ZOFRAN ODT) 4 MG disintegrating tablet Take 1 tablet (4 mg total) by mouth every 8 (eight) hours as needed for nausea or vomiting. 10/16/20  Wieters, Hallie C, PA-C  phenazopyridine (PYRIDIUM) 200 MG tablet Take 1 tablet (200 mg total) by mouth 3 (three) times daily as needed for pain. 10/13/20   Melynda Ripple, MD    Allergies    Flagyl [metronidazole]  Review of Systems   Review of Systems  Constitutional: Negative for chills and fever.  Genitourinary: Positive for vaginal pain. Negative for pelvic pain and vaginal bleeding.  All other systems reviewed and are negative.   Physical Exam Updated Vital Signs BP (!) 141/88   Pulse 95   Temp 98.7 F (37.1 C) (Oral)   Resp 18   Ht 5\' 4"  (1.626 m)   Wt 132.9 kg   LMP 10/06/2020   SpO2 99%   BMI 50.29 kg/m   Physical Exam Vitals and nursing note reviewed.  Constitutional:       Appearance: She is not ill-appearing or diaphoretic.  HENT:     Head: Normocephalic and atraumatic.  Eyes:     Conjunctiva/sclera: Conjunctivae normal.  Cardiovascular:     Rate and Rhythm: Normal rate and regular rhythm.     Pulses: Normal pulses.  Pulmonary:     Effort: Pulmonary effort is normal.     Breath sounds: Normal breath sounds. No wheezing, rhonchi or rales.  Abdominal:     Palpations: Abdomen is soft.     Tenderness: There is no abdominal tenderness.  Genitourinary:    Comments: Chaperone present for exam. Mild erythema to the left labia minora without obvious swelling. No signs of bartholin's cyst.  Small amount of thin light brown discharge to vault. No adnexal masses, tenderness, or fullness. No CMT, cervical friability, or discharge from cervical os. Cervical os is closed. Uterus non-deviated, mobile, nonTTP, and without enlargement.  Musculoskeletal:     Cervical back: Neck supple.  Skin:    General: Skin is warm and dry.  Neurological:     Mental Status: She is alert.     ED Results / Procedures / Treatments   Labs (all labs ordered are listed, but only abnormal results are displayed) Labs Reviewed  WET PREP, GENITAL - Abnormal; Notable for the following components:      Result Value   WBC, Wet Prep HPF POC FEW (*)    All other components within normal limits  URINALYSIS, ROUTINE W REFLEX MICROSCOPIC - Abnormal; Notable for the following components:   APPearance HAZY (*)    Hgb urine dipstick MODERATE (*)    Bacteria, UA RARE (*)    All other components within normal limits  PREGNANCY, URINE    EKG None  Radiology No results found.  Procedures Procedures   Medications Ordered in ED Medications - No data to display  ED Course  I have reviewed the triage vital signs and the nursing notes.  Pertinent labs & imaging results that were available during my care of the patient were reviewed by me and considered in my medical decision making (see  chart for details).    MDM Rules/Calculators/A&P                          35 year old female who presents to the ED today complaining of vaginal irritation for the past 2 weeks.  Was being treated for BV as well as UTI with Macrobid and Flagyl.  Has completed Macrobid course.  Began having any response to Flagyl with complaints of shakiness and illness.  Do not take with alcohol.  She was switched to metronidazole  gel and has since then complained of vaginal swelling as well as continued irritation.  On arrival to the ED vitals were stable.  Patient is afebrile, nontachycardic nontachypneic and appears to be in no acute distress.  We will plan for pelvic exam at this time with repeat wet prep.  She does not want repeat gonorrhea or chlamydia testing at this time as she has not been sexually active since having testing done at urgent care 2 weeks ago.  I feel this is reasonable today.  We will also obtain a urinalysis to see if her UTI has resolved and urine pregnancy test.  UPT negative U/A with moderate hgb, rare bacteria, and 11-20 squamous epithelial cells. No signs of infection today.   Pelvic exam with signs of mild vaginal irritation likely from allergic reaction to the flagyl cream. Wet prep with WBC; no other findings. Pt does have a small amount of thin light brown discharge in vault. I suspect her irritation is more so from the allergic reaction that anything at this time. Per patient she reports she went to [facility] initiallly for dysuria and was made to self swab; she had no symptoms of vaginal discharge at that time. Question if she was overtreated for BV when she did not actually have it. Have recommended Benadryl daily for the next 7 days for allergic response and cold compresses to this area. I do not feel pt requires additional treatment for BV at this time. Will have her follow up with OBGYN for same. She is in agreement with plan and stable for discharge home.   This note was prepared using  Dragon voice recognition software and may include unintentional dictation errors due to the inherent limitations of voice recognition software.  Final Clinical Impression(s) / ED Diagnoses Final diagnoses:  Vaginal irritation  Allergic reaction, initial encounter    Rx / DC Orders ED Discharge Orders    None       Discharge Instructions     Your vaginal irritation is likely from an allergic reaction to both the flagyl oral tablets and flagyl cream that you have taken recently. I would recommend Benadryl daily for the next week and cold compresses to the area to help with symptomatic relief.   Follow up with [facility] for further evaluation.   Return to the ED for any worsening symptoms       Eustaquio Maize, Hershal Coria 10/27/20 Layla Maw, MD 10/27/20 [phone]

## 2020-10-27 NOTE — ED Triage Notes (Signed)
Emergency Medicine Provider Triage Evaluation Note  Margaret Hahn , a 35 y.o. female  was evaluated in triage.  Pt complains of dysuria since April 1st.  She reports that she had a reaction (tingling and shaking) to the flagyl and switched to gel.    She was then treated for yeast infection.  Over the last three days has had pain when she sits, describes it as a burning sensation.   Denies possibility of pregnancy.  Had negative sti tests per her report   Review of Systems  Positive:        Pain with sitting, vulvar pain,  Negative: Fevers  Physical Exam  BP (!) 148/97 (BP Location: Right Arm)   Pulse (!) 102   Temp 98.7 F (37.1 C) (Oral)   Resp 18   Ht 5\' 4"  (1.626 m)   Wt 132.9 kg   LMP 10/06/2020   SpO2 100%   BMI 50.29 kg/m  Gen:   Awake, no distress   HEENT:  Atraumatic  Resp:  Normal effort  Cardiac:  Normal rate  Abd:   Nondistended, nontender  MSK:   Moves extremities without difficulty  Neuro:  Speech clear   Medical Decision Making  Medically screening exam initiated at 3:49 PM.  Appropriate orders placed.  Margaret Hahn was informed that the remainder of the evaluation will be completed by another provider, this initial triage assessment does not replace that evaluation, and the importance of remaining in the ED until their evaluation is complete.  Clinical Impression  Vulvar pain   Lorin Glass, [place] 10/27/20 1552

## 2021-02-09 ENCOUNTER — Encounter (HOSPITAL_COMMUNITY): Payer: Self-pay

## 2021-02-09 ENCOUNTER — Emergency Department (HOSPITAL_COMMUNITY)
Admission: EM | Admit: 2021-02-09 | Discharge: 2021-02-09 | Disposition: A | Discharge status: Home / Self Care | Payer: BC Managed Care – PPO | Attending: Emergency Medicine | Admitting: Emergency Medicine

## 2021-02-09 ENCOUNTER — Other Ambulatory Visit: Payer: Self-pay

## 2021-02-09 DIAGNOSIS — I10 Essential (primary) hypertension: Secondary | ICD-10-CM | POA: Insufficient documentation

## 2021-02-09 DIAGNOSIS — Z113 Encounter for screening for infections with a predominantly sexual mode of transmission: Secondary | ICD-10-CM

## 2021-02-09 DIAGNOSIS — Z202 Contact with and (suspected) exposure to infections with a predominantly sexual mode of transmission: Secondary | ICD-10-CM | POA: Insufficient documentation

## 2021-02-09 DIAGNOSIS — Z79899 Other long term (current) drug therapy: Secondary | ICD-10-CM | POA: Insufficient documentation

## 2021-02-09 LAB — WET PREP, GENITAL
Clue Cells Wet Prep HPF POC: NONE SEEN
Sperm: NONE SEEN
Trich, Wet Prep: NONE SEEN
Yeast Wet Prep HPF POC: NONE SEEN

## 2021-02-09 LAB — URINALYSIS, ROUTINE W REFLEX MICROSCOPIC
Bilirubin Urine: NEGATIVE
Glucose, UA: NEGATIVE mg/dL
Ketones, ur: NEGATIVE mg/dL
Leukocytes,Ua: NEGATIVE
Nitrite: NEGATIVE
Protein, ur: NEGATIVE mg/dL
Specific Gravity, Urine: 1.016 (ref 1.005–1.030)
pH: 5 (ref 5.0–8.0)

## 2021-02-09 LAB — I-STAT BETA HCG BLOOD, ED (MC, WL, AP ONLY): I-stat hCG, quantitative: <5 m[IU]/mL (ref <5)

## 2021-02-09 LAB — RPR: RPR Ser Ql: NONREACTIVE

## 2021-02-09 LAB — HIV ANTIBODY (ROUTINE TESTING W REFLEX): HIV Screen 4th Generation wRfx: NONREACTIVE

## 2021-02-09 NOTE — Discharge Instructions (Addendum)
You have been screened for potential sexually transmitted infection.  Please check result through MyChart, link below.  If positive for infection for sure to notify partner for treated as well.  Return if you have any concern.  Avoid sexual activity until your symptoms completely resolved.

## 2021-02-09 NOTE — ED Triage Notes (Signed)
Pt states that she had unprotected sex last week and feels that she may have been exposed to an STD. Pt has a swollen left labia and states that her vagina feels irritated.

## 2021-02-09 NOTE — ED Provider Notes (Signed)
[facility] DEPT Provider Note   CSN: [id number] Arrival date & time: 02/09/21  0620     History Chief Complaint  Patient presents with   Exposure to STD    Margaret Hahn is a 35 y.o. female.  The history is provided by the patient. No language interpreter was used.  Exposure to STD Pertinent negatives include no abdominal pain.    35 year old female who presents with concerns of potential STI exposure.  Patient report a week ago she had unprotected sex with a new partner.  2 days later she noticed that her left labia was a bit swollen and her vaginal was a bit irritated.  No associated fever or abdominal pain vaginal discharge dysuria or hematuria.  No specific treatment tried.  Denies any prior pregnancy and denies any prior STD.  She would like to be screened for STI.  She denies any new change in personal hygiene products.     Past Medical History:  Diagnosis Date   Hypertension     Patient Active Problem List   Diagnosis Date Noted   PCOS (polycystic ovarian syndrome) 04/25/2013   Metrorrhagia 04/25/2013   HTN (hypertension) 04/25/2013    Past Surgical History:  Procedure Laterality Date   CHOLECYSTECTOMY       OB History   No obstetric history on file.     Family History  Problem Relation Age of Onset   Diabetes Mother     Social History   Tobacco Use   Smoking status: Never   Smokeless tobacco: Never  Vaping Use   Vaping Use: Never used  Substance Use Topics   Alcohol use: No   Drug use: Yes    Types: Marijuana    Comment: once a day    Home Medications Prior to Admission medications   Medication Sig Start Date End Date Taking? Authorizing Provider  cetirizine (ZYRTEC) 10 MG tablet Take 10 mg by mouth daily as needed for allergies.    [provider]  fluconazole (DIFLUCAN) 150 MG tablet Take 1 tablet (150 mg total) by mouth daily. If no relief after 3 days , take second pill 10/19/20   Wieters, Hallie C,  PA-C  ibuprofen (ADVIL) 600 MG tablet Take 1 tablet (600 mg total) by mouth every 6 (six) hours as needed. 10/13/20   Melynda Ripple, MD  lisinopril-hydrochlorothiazide (PRINZIDE,ZESTORETIC) 20-25 MG tablet Take 1 tablet by mouth daily. 07/25/15   [provider]  metroNIDAZOLE (FLAGYL) 500 MG tablet Take 1 tablet (500 mg total) by mouth 2 (two) times daily. 10/15/20   Chase Picket, MD  ondansetron (ZOFRAN ODT) 4 MG disintegrating tablet Take 1 tablet (4 mg total) by mouth every 8 (eight) hours as needed for nausea or vomiting. 10/16/20   Wieters, Hallie C, PA-C  phenazopyridine (PYRIDIUM) 200 MG tablet Take 1 tablet (200 mg total) by mouth 3 (three) times daily as needed for pain. 10/13/20   Melynda Ripple, MD    Allergies    Flagyl [metronidazole]  Review of Systems   Review of Systems  Constitutional:  Negative for fever.  Gastrointestinal:  Negative for abdominal pain.  Genitourinary:  Positive for vaginal pain. Negative for vaginal discharge.  Skin:  Negative for rash.   Physical Exam Updated Vital Signs BP (!) 158/103 (BP Location: Right Arm)   Pulse 97   Temp 98.4 F (36.9 C) (Oral)   Resp 18   Ht '5\' 2"'$  (1.575 m)   Wt (!) 138.3 kg  SpO2 99%   BMI 55.79 kg/m   Physical Exam Vitals and nursing note reviewed.  Constitutional:      General: She is not in acute distress.    Appearance: She is well-developed.  HENT:     Head: Atraumatic.  Eyes:     Conjunctiva/sclera: Conjunctivae normal.  Pulmonary:     Effort: Pulmonary effort is normal.  Musculoskeletal:     Cervical back: Neck supple.  Skin:    Findings: No rash.  Neurological:     Mental Status: She is alert.  Psychiatric:        Mood and Affect: Mood normal.    ED Results / Procedures / Treatments   Labs (all labs ordered are listed, but only abnormal results are displayed) Labs Reviewed  WET PREP, GENITAL - Abnormal; Notable for the following components:      Result Value   WBC, Wet Prep  HPF POC FEW (*)    All other components within normal limits  URINALYSIS, ROUTINE W REFLEX MICROSCOPIC - Abnormal; Notable for the following components:   Hgb urine dipstick SMALL (*)    Bacteria, UA RARE (*)    All other components within normal limits  RPR  HIV ANTIBODY (ROUTINE TESTING W REFLEX)  I-STAT BETA HCG BLOOD, ED ([facility], [facility], [facility] ONLY)  GC/CHLAMYDIA PROBE AMP ([facility]) NOT AT [facility]    EKG None  Radiology No results found.  Procedures Pelvic exam  Date/Time: 02/09/2021 7:27 AM Performed by: Domenic Moras, PA-C Authorized by: Domenic Moras, PA-C  Consent given by: patient Patient understanding: patient states understanding of the procedure being performed Comments: Margaret Griffins, RN, served as chaperone.  No inguinal lymph apathy or inguinal hernia noted.  Normal external genitalia.  No significant discomfort with speculum insertion.  Close cervical os with trace of blood noted at the os.  Vaginal vault with minimal vaginal discharge and no odor.  On bimanual examination no cervical motion tenderness or adnexal tenderness.  Exam however is limited due to large body habitus.     Medications Ordered in ED Medications - No data to display  ED Course  I have reviewed the triage vital signs and the nursing notes.  Pertinent labs & imaging results that were available during my care of the patient were reviewed by me and considered in my medical decision making (see chart for details).    MDM Rules/Calculators/A&P                           BP 133/69   Pulse 79   Temp 98.4 F (36.9 C) (Oral)   Resp 18   Ht '5\' 2"'$  (1.575 m)   Wt (!) 138.3 kg   SpO2 99%   BMI 55.79 kg/m   Final Clinical Impression(s) / ED Diagnoses Final diagnoses:  Screen for STD (sexually transmitted disease)    Rx / DC Orders ED Discharge Orders     None      Patient voiced concern for potential STIs and also endorsed some left labial irritation.  I was unable to visualize any abnormalities on my  initial pelvic examination.  No evidence of PID.  STI screening performed.  So far labs are reassuring.  Patient will follow up on the rest of the result through [facility].  Recommend safe sex.  Return precaution given.   Domenic Moras, PA-C 02/09/21 [id number]    Arnaldo Natal, MD 02/09/21 1150

## 2021-02-10 LAB — GC/CHLAMYDIA PROBE AMP (~~LOC~~) NOT AT ARMC
Chlamydia: NEGATIVE
Comment: NEGATIVE
Comment: NORMAL
Neisseria Gonorrhea: NEGATIVE

## 2021-04-11 HISTORY — PX: MULTIPLE TOOTH EXTRACTIONS: SHX2053

## 2021-05-08 DIAGNOSIS — Z1159 Encounter for screening for other viral diseases: Secondary | ICD-10-CM | POA: Diagnosis not present

## 2021-05-08 DIAGNOSIS — R5383 Other fatigue: Secondary | ICD-10-CM | POA: Diagnosis not present

## 2021-05-08 DIAGNOSIS — Z136 Encounter for screening for cardiovascular disorders: Secondary | ICD-10-CM | POA: Diagnosis not present

## 2021-05-08 DIAGNOSIS — R7303 Prediabetes: Secondary | ICD-10-CM | POA: Diagnosis not present

## 2021-05-08 DIAGNOSIS — Z7689 Persons encountering health services in other specified circumstances: Secondary | ICD-10-CM | POA: Diagnosis not present

## 2021-05-08 DIAGNOSIS — E282 Polycystic ovarian syndrome: Secondary | ICD-10-CM | POA: Diagnosis not present

## 2021-05-12 DIAGNOSIS — R7303 Prediabetes: Secondary | ICD-10-CM | POA: Diagnosis not present

## 2021-05-12 DIAGNOSIS — R5383 Other fatigue: Secondary | ICD-10-CM | POA: Diagnosis not present

## 2021-05-12 DIAGNOSIS — E785 Hyperlipidemia, unspecified: Secondary | ICD-10-CM | POA: Diagnosis not present

## 2021-05-12 DIAGNOSIS — E282 Polycystic ovarian syndrome: Secondary | ICD-10-CM | POA: Diagnosis not present

## 2021-05-12 DIAGNOSIS — Z6841 Body Mass Index (BMI) 40.0 and over, adult: Secondary | ICD-10-CM | POA: Diagnosis not present

## 2021-05-12 DIAGNOSIS — Z0001 Encounter for general adult medical examination with abnormal findings: Secondary | ICD-10-CM | POA: Diagnosis not present

## 2021-05-28 ENCOUNTER — Ambulatory Visit: Payer: Self-pay | Admitting: *Deleted

## 2021-05-28 NOTE — Telephone Encounter (Signed)
Reason for Disposition  SEVERE vaginal bleeding (e.g., soaking 2 pads or tampons per hour and present 2 or more hours; 1 menstrual cup every 2 hours)  Answer Assessment - Initial Assessment Questions 1. AMOUNT: "Describe the bleeding that you are having."    - SPOTTING: spotting, or pinkish / brownish mucous discharge; does not fill panty liner or pad    - MILD:  less than 1 pad / hour; less than patient's usual menstrual bleeding   - MODERATE: 1-2 pads / hour; 1 menstrual cup every 6 hours; small-medium blood clots (e.g., pea, grape, small coin)   - SEVERE: soaking 2 or more pads/hour for 2 or more hours; 1 menstrual cup every 2 hours; bleeding not contained by pads or continuous red blood from vagina; large blood clots (e.g., golf ball, large coin)      severe 2. ONSET: "When did the bleeding begin?" "Is it continuing now?"     78 days ago  3. MENSTRUAL PERIOD: "When was the last normal menstrual period?" "How is this different than your period?"     Na  4. REGULARITY: "How regular are your periods?"     Constant bleeding  5. ABDOMINAL PAIN: "Do you have any pain?" "How bad is the pain?"  (e.g., Scale 1-10; mild, moderate, or severe)   - MILD (1-3): doesn't interfere with normal activities, abdomen soft and not tender to touch    - MODERATE (4-7): interferes with normal activities or awakens from sleep, abdomen tender to touch    - SEVERE (8-10): excruciating pain, doubled over, unable to do any normal activities      Yes cramping  6. PREGNANCY: "Could you be pregnant?" "Are you sexually active?" "Did you recently give birth?"     na 7. BREASTFEEDING: "Are you breastfeeding?"     na 8. HORMONES: "Are you taking any hormone medications, prescription or OTC?" (e.g., birth control pills, estrogen)     Was taking birth control  9. BLOOD THINNERS: "Do you take any blood thinners?" (e.g., Coumadin/warfarin, Pradaxa/dabigatran, aspirin)     Denies  10. CAUSE: "What do you think is causing the  bleeding?" (e.g., recent gyn surgery, recent gyn procedure; known bleeding disorder, cervical cancer, polycystic ovarian disease, fibroids)         Not sure . Has been seen in multiple EDs 11. HEMODYNAMIC STATUS: "Are you weak or feeling lightheaded?" If Yes, ask: "Can you stand and walk normally?"        Yes lightheaded, dizziness and weakness 12. OTHER SYMPTOMS: "What other symptoms are you having with the bleeding?" (e.g., passed tissue, vaginal discharge, fever, menstrual-type cramps)       Abdominal cramping  Protocols used: Vaginal Bleeding - Abnormal-A-AH

## 2021-05-28 NOTE — Telephone Encounter (Signed)
C/o vaginal excessive bleeding x 78 days . Bleeding may stop 1-2 days and then return with soaking 2 or more pads in 2 hours. C/o weakness and dizziness and lightheadedness. Denies passing out or faint feeling, no chest pain or difficulty breathing.  C/o she has been to multiple dr and EDs and has been recommended to see GYN. Patient reports she is trying to get in sooner with GYN but no appt available until Jan. Recommended ED due to c/o weakness, dizziness and lightheadedness. Encouraged to drink plenty of fluids. Care advise given. Patient verbalized understanding of care advise and to call back or go to ED or call 911 if symptoms worsen.

## 2021-06-22 DIAGNOSIS — E282 Polycystic ovarian syndrome: Secondary | ICD-10-CM | POA: Diagnosis not present

## 2021-06-22 DIAGNOSIS — I10 Essential (primary) hypertension: Secondary | ICD-10-CM | POA: Diagnosis not present

## 2021-06-22 DIAGNOSIS — N938 Other specified abnormal uterine and vaginal bleeding: Secondary | ICD-10-CM | POA: Diagnosis not present

## 2021-06-22 DIAGNOSIS — Z124 Encounter for screening for malignant neoplasm of cervix: Secondary | ICD-10-CM | POA: Diagnosis not present

## 2021-06-22 DIAGNOSIS — Z01419 Encounter for gynecological examination (general) (routine) without abnormal findings: Secondary | ICD-10-CM | POA: Diagnosis not present

## 2021-07-08 DIAGNOSIS — N938 Other specified abnormal uterine and vaginal bleeding: Secondary | ICD-10-CM | POA: Diagnosis not present

## 2021-07-08 DIAGNOSIS — D250 Submucous leiomyoma of uterus: Secondary | ICD-10-CM | POA: Diagnosis not present

## 2021-07-08 DIAGNOSIS — D251 Intramural leiomyoma of uterus: Secondary | ICD-10-CM | POA: Diagnosis not present

## 2021-08-20 ENCOUNTER — Emergency Department (HOSPITAL_COMMUNITY): Payer: BC Managed Care – PPO

## 2021-08-20 ENCOUNTER — Emergency Department (HOSPITAL_COMMUNITY)
Admission: EM | Admit: 2021-08-20 | Discharge: 2021-08-20 | Disposition: A | Discharge status: Home / Self Care | Payer: BC Managed Care – PPO | Attending: Emergency Medicine | Admitting: Emergency Medicine

## 2021-08-20 ENCOUNTER — Encounter (HOSPITAL_COMMUNITY): Payer: Self-pay

## 2021-08-20 DIAGNOSIS — D259 Leiomyoma of uterus, unspecified: Secondary | ICD-10-CM

## 2021-08-20 DIAGNOSIS — Z79899 Other long term (current) drug therapy: Secondary | ICD-10-CM | POA: Insufficient documentation

## 2021-08-20 DIAGNOSIS — I10 Essential (primary) hypertension: Secondary | ICD-10-CM | POA: Insufficient documentation

## 2021-08-20 DIAGNOSIS — R102 Pelvic and perineal pain: Secondary | ICD-10-CM | POA: Diagnosis not present

## 2021-08-20 DIAGNOSIS — N939 Abnormal uterine and vaginal bleeding, unspecified: Secondary | ICD-10-CM | POA: Diagnosis not present

## 2021-08-20 LAB — I-STAT BETA HCG BLOOD, ED (MC, WL, AP ONLY): I-stat hCG, quantitative: <5 m[IU]/mL (ref <5)

## 2021-08-20 LAB — URINALYSIS, MICROSCOPIC (REFLEX): RBC / HPF: >50 RBC/hpf (ref 0–5)

## 2021-08-20 LAB — CBC WITH DIFFERENTIAL/PLATELET
Abs Immature Granulocytes: 0.03 10*3/uL (ref 0.00–0.07)
Basophils Absolute: 0 10*3/uL (ref 0.0–0.1)
Basophils Relative: 0 %
Eosinophils Absolute: 0.1 10*3/uL (ref 0.0–0.5)
Eosinophils Relative: 1 %
HCT: 36.3 % (ref 36.0–46.0)
Hemoglobin: 11.2 g/dL — ABNORMAL LOW (ref 12.0–15.0)
Immature Granulocytes: 0 %
Lymphocytes Relative: 31 %
Lymphs Abs: 2.4 10*3/uL (ref 0.7–4.0)
MCH: 28.1 pg (ref 26.0–34.0)
MCHC: 30.9 g/dL (ref 30.0–36.0)
MCV: 91.2 fL (ref 80.0–100.0)
Monocytes Absolute: 0.4 10*3/uL (ref 0.1–1.0)
Monocytes Relative: 6 %
Neutro Abs: 4.8 10*3/uL (ref 1.7–7.7)
Neutrophils Relative %: 62 %
Platelets: 363 10*3/uL (ref 150–400)
RBC: 3.98 MIL/uL (ref 3.87–5.11)
RDW: 14 % (ref 11.5–15.5)
WBC: 7.8 10*3/uL (ref 4.0–10.5)
nRBC: 0 % (ref 0.0–0.2)

## 2021-08-20 LAB — BASIC METABOLIC PANEL
Anion gap: 8 (ref 5–15)
BUN: 13 mg/dL (ref 6–20)
CO2: 25 mmol/L (ref 22–32)
Calcium: 9.4 mg/dL (ref 8.9–10.3)
Chloride: 108 mmol/L (ref 98–111)
Creatinine, Ser: 0.82 mg/dL (ref 0.44–1.00)
GFR, Estimated: >60 mL/min (ref >60)
Glucose, Bld: 124 mg/dL — ABNORMAL HIGH (ref 70–99)
Potassium: 3.9 mmol/L (ref 3.5–5.1)
Sodium: 141 mmol/L (ref 135–145)

## 2021-08-20 LAB — URINALYSIS, ROUTINE W REFLEX MICROSCOPIC

## 2021-08-20 LAB — TYPE AND SCREEN
ABO/RH(D): A POS
Antibody Screen: NEGATIVE

## 2021-08-20 MED ORDER — MEGESTROL ACETATE 40 MG PO TABS: 40.0000 mg | ORAL_TABLET | Freq: Every day | ORAL | 0 refills | Status: DC

## 2021-08-20 MED ORDER — KETOROLAC TROMETHAMINE 30 MG/ML IJ SOLN
30.0000 mg | Freq: Once | INTRAMUSCULAR | Status: AC
  Administered 2021-08-20: 30 mg via INTRAMUSCULAR
  Filled 2021-08-20: qty 1

## 2021-08-20 NOTE — ED Triage Notes (Addendum)
Pt presents with c/o heavy vaginal bleeding for approx one month. Pt reports that she has been diagnosed with a fibroid. Pt also reports lower right quad abdominal pain.

## 2021-08-20 NOTE — ED Provider Triage Note (Signed)
Emergency Medicine Provider Triage Evaluation Note  Margaret Hahn , a 36 y.o. female  was evaluated in triage.  Pt complains of vaginal bleeding and pelvic pain.  Patient reports she has had persistent bleeding for a month that has been heavier in the last week and she has been passing clots.  She also reports right pelvic pain that radiates back to her back that has been present and worsening over the past week.  Known history of a fibroid and has had similar pain before but has not been this severe previously.  No associated urinary symptoms, no upper abdominal pain or vomiting.  Does report some lightheadedness and fatigue with increased bleeding.  Review of Systems  Positive: Vaginal bleeding, pelvic pain, lightheadedness, fatigue Negative: Fevers, vomiting  Physical Exam  BP (!) 179/94 (BP Location: Right Arm)    Pulse (!) 104    Temp 98.5 F (36.9 C) (Oral)    Resp 18    LMP 08/20/2021 (Approximate)    SpO2 100%  Gen:   Awake, no distress   Resp:  Normal effort  MSK:   Moves extremities without difficulty  Other:    Medical Decision Making  Medically screening exam initiated at 10:01 AM.  Appropriate orders placed.  Tejal Monroy was informed that the remainder of the evaluation will be completed by another provider, this initial triage assessment does not replace that evaluation, and the importance of remaining in the ED until their evaluation is complete.  Work-up initiated for vaginal bleeding and pelvic pain.   [name], [place] 08/20/21 1006

## 2021-08-20 NOTE — ED Notes (Signed)
Ultrasound at bedside

## 2021-08-20 NOTE — ED Provider Notes (Signed)
[facility] DEPT Provider Note   CSN: [id number] Arrival date & time: 08/20/21  0941     History  Chief Complaint  Patient presents with   Vaginal Bleeding    Jeffrie Stander is a 36 y.o. female.  Arlicia Paquette , a 36 y.o. female with history of hypertension, who presents with vaginal bleeding and pelvic pain.  Patient reports she has had persistent bleeding for a month that has been heavier in the last week and she has been passing clots.  Patient also reports that this week would have been her normal menstrual cycle.  She also reports right pelvic pain that radiates back to her back that has been present and worsening over the past week.  Known history of a fibroid and has had similar pain before but has not been this severe previously.  No associated urinary symptoms, no upper abdominal pain or vomiting.  Does report some lightheadedness and fatigue with increased bleeding.  Has upcoming follow-up appointment with new OB/GYN on Tuesday.  Has been prescribed Megace previously and responded well to this but when she ran out of this medication bleeding returned.    The history is provided by the patient.      Home Medications Prior to Admission medications   Medication Sig Start Date End Date Taking? Authorizing Provider  cetirizine (ZYRTEC) 10 MG tablet Take 10 mg by mouth daily as needed for allergies.    [provider]  fluconazole (DIFLUCAN) 150 MG tablet Take 1 tablet (150 mg total) by mouth daily. If no relief after 3 days , take second pill 10/19/20   Wieters, Hallie C, PA-C  ibuprofen (ADVIL) 600 MG tablet Take 1 tablet (600 mg total) by mouth every 6 (six) hours as needed. 10/13/20   Melynda Ripple, MD  lisinopril-hydrochlorothiazide (PRINZIDE,ZESTORETIC) 20-25 MG tablet Take 1 tablet by mouth daily. 07/25/15   [provider]  metroNIDAZOLE (FLAGYL) 500 MG tablet Take 1 tablet (500 mg total) by mouth 2 (two) times daily. 10/15/20    Chase Picket, MD  ondansetron (ZOFRAN ODT) 4 MG disintegrating tablet Take 1 tablet (4 mg total) by mouth every 8 (eight) hours as needed for nausea or vomiting. 10/16/20   Wieters, Hallie C, PA-C  phenazopyridine (PYRIDIUM) 200 MG tablet Take 1 tablet (200 mg total) by mouth 3 (three) times daily as needed for pain. 10/13/20   Melynda Ripple, MD      Allergies    Flagyl [metronidazole]    Review of Systems   Review of Systems  Constitutional:  Negative for chills and fever.  Gastrointestinal:  Negative for abdominal pain, nausea and vomiting.  Genitourinary:  Positive for pelvic pain and vaginal bleeding. Negative for dysuria and vaginal discharge.  All other systems reviewed and are negative.  Physical Exam Updated Vital Signs BP (!) 179/94 (BP Location: Right Arm)    Pulse (!) 104    Temp 98.5 F (36.9 C) (Oral)    Resp 18    LMP 08/20/2021 (Approximate)    SpO2 100%  Physical Exam Vitals and nursing note reviewed.  Constitutional:      General: She is not in acute distress.    Appearance: Normal appearance. She is well-developed. She is not ill-appearing or diaphoretic.  HENT:     Head: Normocephalic and atraumatic.  Eyes:     General:        Right eye: No discharge.        Left eye: No discharge.  Cardiovascular:  Rate and Rhythm: Normal rate and regular rhythm.     Pulses: Normal pulses.     Heart sounds: Normal heart sounds.  Pulmonary:     Effort: Pulmonary effort is normal. No respiratory distress.     Breath sounds: Normal breath sounds. No wheezing or rales.     Comments: Respirations equal and unlabored, patient able to speak in full sentences, lungs clear to auscultation bilaterally  Abdominal:     General: Bowel sounds are normal. There is no distension.     Palpations: Abdomen is soft. There is no mass.     Tenderness: There is no abdominal tenderness. There is no guarding.     Comments: Abdomen soft, nondistended, mild lower abdominal tenderness  without guarding  Genitourinary:    Comments: Patient just recently had a pelvic exam and deferred pelvic today Musculoskeletal:        General: No deformity.     Cervical back: Neck supple.  Skin:    General: Skin is warm and dry.     Capillary Refill: Capillary refill takes less than 2 seconds.  Neurological:     Mental Status: She is alert and oriented to person, place, and time.     Coordination: Coordination normal.     Comments: Speech is clear, able to follow commands Moves extremities without ataxia, coordination intact  Psychiatric:        Mood and Affect: Mood normal.        Behavior: Behavior normal.    ED Results / Procedures / Treatments   Labs (all labs ordered are listed, but only abnormal results are displayed) Labs Reviewed  BASIC METABOLIC PANEL - Abnormal; Notable for the following components:      Result Value   Glucose, Bld 124 (*)    All other components within normal limits  CBC WITH DIFFERENTIAL/PLATELET - Abnormal; Notable for the following components:   Hemoglobin 11.2 (*)    All other components within normal limits  URINALYSIS, ROUTINE W REFLEX MICROSCOPIC  I-STAT BETA HCG BLOOD, ED ([facility], [facility], [facility] ONLY)  TYPE AND SCREEN  ABO/RH    EKG None  Radiology US PELVIC COMPLETE W TRANSVAGINAL AND TORSION R/O  Result Date: 08/20/2021 CLINICAL DATA:  Vaginal bleeding x1 month with pelvic pain. EXAM: TRANSABDOMINAL AND TRANSVAGINAL ULTRASOUND OF PELVIS DOPPLER ULTRASOUND OF OVARIES TECHNIQUE: Both transabdominal and transvaginal ultrasound examinations of the pelvis were performed. Transabdominal technique was performed for global imaging of the pelvis including uterus, ovaries, adnexal regions, and pelvic cul-de-sac. It was necessary to proceed with endovaginal exam following the transabdominal exam to visualize the ovaries. Color and duplex Doppler ultrasound was utilized to evaluate blood flow to the ovaries. COMPARISON:  Ultrasound 06/21/2016 FINDINGS: Uterus  Measurements: 8.8 x 5.8 x 4.8 cm = volume: 129 mL. There is a 2.1 x 2 x 1.8 cm intramural uterine lesion which likely reflects a fibroid. Endometrium Thickness: 5.6 mm. Slightly heterogeneous in appearance but is within normal limits for a premenopausal female. Right ovary Measurements: 1.9 x 1.8 x 1.5 cm = volume: 2.7 mL. Normal appearance/no adnexal mass. Left ovary Measurements: 2.5 x 1.6 x 1.1 cm = volume: 2.2 mL. Normal appearance/no adnexal mass. Pulsed Doppler evaluation of both ovaries demonstrates normal low-resistance arterial and venous waveforms. Other findings No abnormal free fluid. IMPRESSION: 1. No acute abnormality. No sonographic evidence for ovarian torsion. 2. Probable 2.1 cm intramural fibroid. 3. Slightly heterogeneous endometrium, which is within normal limits for a premenopausal female. However, if abnormal uterine bleeding  persists despite hormone therapy consider further evaluation with sonohysterogram for further evaluation, prior to hysteroscopy or endometrial biopsy. Electronically Signed   By: Dahlia Bailiff M.D.   On: 08/20/2021 11:49    Procedures Procedures    Medications Ordered in ED Medications - No data to display  ED Course/ Medical Decision Making/ A&P                            36 y.o. female presents to the ED with complaints of vaginal bleeding and pelvic pain, this involves an extensive number of treatment options, and is a complaint that carries with it a high risk of complications and morbidity.  The differential diagnosis includes uterine fibroid, ectopic, ovarian cyst, ovarian torsion, PID  On arrival pt is nontoxic, vitals significant for mild hypertension, otherwise unremarkable. Exam with very mild lower abdominal tenderness, patient just recently had pelvic exam done, no concern for infection, and has upcoming OB/GYN evaluation on Tuesday.  We will hold off on repeat pelvic exam at this time.  Additional history obtained from chart review.  Outside  records obtained and reviewed including prior OB/GYN visits.  I ordered Toradol for pain.  Lab Tests:  I Ordered, reviewed, and interpreted labs, which included: Hemoglobin is stable at 11.2, unchanged despite reported continual bleeding, no significant electrolyte derangements, negative pregnancy.  Imaging Studies ordered:  I ordered imaging studies which included pelvic ultrasound, I independently visualized and interpreted imaging which showed 2.1 cm uterine fibroid but no other significant abnormalities  ED Course:   Patient with increased vaginal bleeding in the setting of known uterine fibroid, no other acute findings on work-up today and hemoglobin is stable.  Patient has upcoming appointment with OB/GYN on Tuesday.  Has responded well to Megace previously.  Will prescribe Megace 40 mg daily, and have patient continue using ibuprofen as needed for pain.  Discussed strict follow-up and return precautions.  Patient expresses understanding and agreement.  Feel she is appropriate for discharge home at this time.    Portions of this note were generated with [employer]. Dictation errors may occur despite best attempts at proofreading.        Final Clinical Impression(s) / ED Diagnoses Final diagnoses:  Vaginal bleeding  Pelvic pain    Rx / DC Orders ED Discharge Orders     None         Janet Berlin 08/20/21 1348    Valarie Merino, MD 08/20/21 [phone]

## 2021-08-20 NOTE — Discharge Instructions (Addendum)
Continue using ibuprofen as needed for pain.  Take Megace once daily.  Follow-up with OB/GYN on Tuesday as planned.  Ultrasound shows 2.1 cm uterine fibroid.  Your hemoglobin was stable today.  Return for any new or worsening symptoms.

## 2021-08-25 DIAGNOSIS — N939 Abnormal uterine and vaginal bleeding, unspecified: Secondary | ICD-10-CM | POA: Diagnosis not present

## 2021-08-25 DIAGNOSIS — D259 Leiomyoma of uterus, unspecified: Secondary | ICD-10-CM | POA: Diagnosis not present

## 2021-08-25 DIAGNOSIS — N6452 Nipple discharge: Secondary | ICD-10-CM | POA: Diagnosis not present

## 2021-09-22 DIAGNOSIS — R739 Hyperglycemia, unspecified: Secondary | ICD-10-CM | POA: Diagnosis not present

## 2021-09-22 DIAGNOSIS — I10 Essential (primary) hypertension: Secondary | ICD-10-CM | POA: Diagnosis not present

## 2021-09-22 DIAGNOSIS — E282 Polycystic ovarian syndrome: Secondary | ICD-10-CM | POA: Diagnosis not present

## 2021-09-22 DIAGNOSIS — R635 Abnormal weight gain: Secondary | ICD-10-CM | POA: Diagnosis not present

## 2021-10-21 NOTE — Progress Notes (Signed)
Received pt surgical clearance via fax from Dr Melba Coon office, placed w/ chart. ?

## 2021-10-26 DIAGNOSIS — Z01818 Encounter for other preprocedural examination: Secondary | ICD-10-CM | POA: Diagnosis not present

## 2021-11-03 ENCOUNTER — Encounter (HOSPITAL_COMMUNITY)
Admission: RE | Admit: 2021-11-03 | Discharge: 2021-11-03 | Disposition: A | Discharge status: Home / Self Care | Payer: BC Managed Care – PPO | Source: Ambulatory Visit | Attending: Obstetrics and Gynecology | Admitting: Obstetrics and Gynecology

## 2021-11-03 DIAGNOSIS — Z79899 Other long term (current) drug therapy: Secondary | ICD-10-CM | POA: Diagnosis not present

## 2021-11-03 DIAGNOSIS — E7800 Pure hypercholesterolemia, unspecified: Secondary | ICD-10-CM | POA: Diagnosis not present

## 2021-11-03 DIAGNOSIS — Z6841 Body Mass Index (BMI) 40.0 and over, adult: Secondary | ICD-10-CM | POA: Diagnosis not present

## 2021-11-03 DIAGNOSIS — I10 Essential (primary) hypertension: Secondary | ICD-10-CM | POA: Diagnosis not present

## 2021-11-03 DIAGNOSIS — Z7984 Long term (current) use of oral hypoglycemic drugs: Secondary | ICD-10-CM | POA: Diagnosis not present

## 2021-11-03 DIAGNOSIS — N939 Abnormal uterine and vaginal bleeding, unspecified: Secondary | ICD-10-CM | POA: Insufficient documentation

## 2021-11-03 DIAGNOSIS — Z01818 Encounter for other preprocedural examination: Secondary | ICD-10-CM | POA: Insufficient documentation

## 2021-11-03 DIAGNOSIS — N840 Polyp of corpus uteri: Secondary | ICD-10-CM | POA: Diagnosis not present

## 2021-11-03 DIAGNOSIS — D250 Submucous leiomyoma of uterus: Secondary | ICD-10-CM | POA: Diagnosis not present

## 2021-11-03 LAB — COMPREHENSIVE METABOLIC PANEL
ALT: 17 U/L (ref 0–44)
AST: 21 U/L (ref 15–41)
Albumin: 4.2 g/dL (ref 3.5–5.0)
Alkaline Phosphatase: 39 U/L (ref 38–126)
Anion gap: 8 (ref 5–15)
BUN: 7 mg/dL (ref 6–20)
CO2: 26 mmol/L (ref 22–32)
Calcium: 9.3 mg/dL (ref 8.9–10.3)
Chloride: 107 mmol/L (ref 98–111)
Creatinine, Ser: 0.65 mg/dL (ref 0.44–1.00)
GFR, Estimated: >60 mL/min (ref >60)
Glucose, Bld: 113 mg/dL — ABNORMAL HIGH (ref 70–99)
Potassium: 3.8 mmol/L (ref 3.5–5.1)
Sodium: 141 mmol/L (ref 135–145)
Total Bilirubin: 0.5 mg/dL (ref 0.3–1.2)
Total Protein: 7.7 g/dL (ref 6.5–8.1)

## 2021-11-03 LAB — CBC
HCT: 37.7 % (ref 36.0–46.0)
Hemoglobin: 11.4 g/dL — ABNORMAL LOW (ref 12.0–15.0)
MCH: 27.5 pg (ref 26.0–34.0)
MCHC: 30.2 g/dL (ref 30.0–36.0)
MCV: 90.8 fL (ref 80.0–100.0)
Platelets: 370 10*3/uL (ref 150–400)
RBC: 4.15 MIL/uL (ref 3.87–5.11)
RDW: 14.6 % (ref 11.5–15.5)
WBC: 6.9 10*3/uL (ref 4.0–10.5)
nRBC: 0 % (ref 0.0–0.2)

## 2021-11-04 ENCOUNTER — Other Ambulatory Visit: Payer: Self-pay

## 2021-11-04 ENCOUNTER — Encounter (HOSPITAL_BASED_OUTPATIENT_CLINIC_OR_DEPARTMENT_OTHER): Payer: Self-pay | Admitting: Obstetrics and Gynecology

## 2021-11-04 DIAGNOSIS — N939 Abnormal uterine and vaginal bleeding, unspecified: Secondary | ICD-10-CM

## 2021-11-04 DIAGNOSIS — R52 Pain, unspecified: Secondary | ICD-10-CM

## 2021-11-04 DIAGNOSIS — D259 Leiomyoma of uterus, unspecified: Secondary | ICD-10-CM

## 2021-11-04 DIAGNOSIS — R102 Pelvic and perineal pain unspecified side: Secondary | ICD-10-CM

## 2021-11-04 HISTORY — DX: Leiomyoma of uterus, unspecified: D25.9

## 2021-11-04 HISTORY — DX: Pelvic and perineal pain unspecified side: R10.20

## 2021-11-04 HISTORY — DX: Abnormal uterine and vaginal bleeding, unspecified: N93.9

## 2021-11-04 HISTORY — DX: Pelvic and perineal pain: R10.2

## 2021-11-04 HISTORY — DX: Pain, unspecified: R52

## 2021-11-04 NOTE — H&P (Signed)
Margaret Hahn is an 36 y.o. female G0P0 with AUB for hysterocopy. On TVUS has submucosal fibroid.  D/W pt r/b/a of hysteroscopy/D&C/myosure, also process and expectations.  Pt's medical history complicated by morbid obesity (BMI 51), HTN, insulin resistance and high cholesterol.   ? ?Pertinent Gynecological History: ?G0 ?No abn pap, last 06/22/21, [facility] HR HPV neg ?Female partner ?No STDs ?Menstrual History: ?Patient's last menstrual period was 10/25/2021. ?  ? ?Past Medical History:  ?Diagnosis Date  ? Abnormal uterine bleeding (AUB) 11/04/2021  ? Anxiety   ? no meds taken  ? Hyperlipidemia   ? Hypertension   ? Leiomyoma of uterus 11/04/2021  ? Pain in right leg @ times due to uterine fibroids 11/04/2021  ? Pelvic pain 11/04/2021  ? Pre-diabetes   ? takes metformin  ? uterine fibroids   ? Wears glasses   ? ? ?Past Surgical History:  ?Procedure Laterality Date  ? CHOLECYSTECTOMY, LAPAROSCOPIC    ? 2011 or 2012  ? LAPAROSCOPIC APPENDECTOMY  2006  ? MULTIPLE TOOTH EXTRACTIONS  04/2021  ? 2 botton teeth  ? ? ?Family History  ?Problem Relation Age of Onset  ? Diabetes Mother   ?HTN ? ?Social History:  reports that she has never smoked. She has never used smokeless tobacco. She reports current alcohol use. She reports current drug use. Drug: Marijuana., married, lacto-ovo-vegetarian ? ?Allergies:  ?Allergies  ?Allergen Reactions  ? Flagyl [Metronidazole]   ?  Oral pill causes tremors  ? ? ?Meds: amlodipine, lisinopril 20/HCTZ 25, metformin '500mg'$ , rosuvastatin ? ?Review of Systems  ?Constitutional: Negative.   ?HENT: Negative.    ?Respiratory: Negative.    ?Cardiovascular: Negative.   ?Gastrointestinal: Negative.   ?Genitourinary:  Positive for menstrual problem and pelvic pain.  ?Musculoskeletal: Negative.   ?Skin: Negative.   ?Neurological: Negative.   ?Psychiatric/Behavioral: Negative.    ? ?Last menstrual period 10/25/2021. ?Physical Exam ?Constitutional:   ?   Appearance: Normal appearance.  ?HENT:  ?   Head: Normocephalic  and atraumatic.  ?Cardiovascular:  ?   Rate and Rhythm: Normal rate and regular rhythm.  ?Pulmonary:  ?   Effort: Pulmonary effort is normal.  ?   Breath sounds: Normal breath sounds.  ?Abdominal:  ?   General: Bowel sounds are normal.  ?   Palpations: Abdomen is soft.  ?Genitourinary: ?   Rectum: Normal.  ?Musculoskeletal:     ?   General: Normal range of motion.  ?   Cervical back: Normal range of motion and neck supple.  ?Skin: ?   General: Skin is warm and dry.  ?Neurological:  ?   General: No focal deficit present.  ?   Mental Status: She is alert and oriented to person, place, and time.  ? ?[place] - intramural fibroid and submucosal fibroid, nl ovaries ? ?Assessment/Plan: ?36yo G0 with AUB/submucosal fibroid for hysteroscopy.D&C and myosure ?D/w pt r/b/a of procedure ?Will proceed ?Pt has medical clearance ? ?Nader Boys Bovard-Stuckert ?11/04/2021, 5:50 PM ? ?

## 2021-11-04 NOTE — Progress Notes (Signed)
Spoke w/ via phone for pre-op interview---pt ?Lab needs dos----      urine preg         ?Lab results------ekg 11-03-2021 chart/epic, cbc cmet t & s 11-03-2021 epic ?COVID test -----patient states asymptomatic no test needed ?Arrive at -------962 am 11-06-2021 ?NPO after MN NO Solid Food.  Clear liquids from MN until---545 am ?Med rec completed ?Medications to take morning of surgery -----Certrizine, Amlodipine, Rosuvastain ?Diabetic medication -----no metformin day of surgery ?Patient instructed no nail polish to be worn day of surgery ?Patient instructed to bring photo id and insurance card day of surgery ?Patient aware to have Driver (ride ) / caregiver   wife Margaret Hahn cell [phone] cell   for 24 hours after surgery ?mother Margaret Hahn also coming dos ?Patient Special Instructions -----none ?Pre-Op special Istructions -----none ?Patient verbalized understanding of instructions that were given at this phone interview. ?Patient denies shortness of breath, chest pain, fever, cough at this phone interview.  ? ?Medical clearance dr Lin Landsman [facility] on chart for 11-06-2021 surgery ? ?Reviewed medical history and bmi 51.92 with dr c Lanetta Inch mda,  pt ok for 11-06-2021 surgery at [facility] per dr c Lanetta Inch mda. ?

## 2021-11-06 ENCOUNTER — Encounter (HOSPITAL_BASED_OUTPATIENT_CLINIC_OR_DEPARTMENT_OTHER): Payer: Self-pay | Admitting: Obstetrics and Gynecology

## 2021-11-06 ENCOUNTER — Ambulatory Visit (HOSPITAL_BASED_OUTPATIENT_CLINIC_OR_DEPARTMENT_OTHER): Payer: BC Managed Care – PPO | Admitting: Anesthesiology

## 2021-11-06 ENCOUNTER — Ambulatory Visit (HOSPITAL_BASED_OUTPATIENT_CLINIC_OR_DEPARTMENT_OTHER)
Admission: RE | Admit: 2021-11-06 | Discharge: 2021-11-06 | Disposition: A | Discharge status: Home / Self Care | Payer: BC Managed Care – PPO | Attending: Obstetrics and Gynecology | Admitting: Obstetrics and Gynecology

## 2021-11-06 ENCOUNTER — Other Ambulatory Visit: Payer: Self-pay

## 2021-11-06 ENCOUNTER — Encounter (HOSPITAL_BASED_OUTPATIENT_CLINIC_OR_DEPARTMENT_OTHER)
Admission: RE | Disposition: A | Discharge status: Home / Self Care | Payer: Self-pay | Source: Home / Self Care | Attending: Obstetrics and Gynecology

## 2021-11-06 DIAGNOSIS — R102 Pelvic and perineal pain unspecified side: Secondary | ICD-10-CM | POA: Diagnosis present

## 2021-11-06 DIAGNOSIS — Z6841 Body Mass Index (BMI) 40.0 and over, adult: Secondary | ICD-10-CM | POA: Insufficient documentation

## 2021-11-06 DIAGNOSIS — I10 Essential (primary) hypertension: Secondary | ICD-10-CM | POA: Diagnosis not present

## 2021-11-06 DIAGNOSIS — D259 Leiomyoma of uterus, unspecified: Secondary | ICD-10-CM | POA: Diagnosis present

## 2021-11-06 DIAGNOSIS — Z79899 Other long term (current) drug therapy: Secondary | ICD-10-CM | POA: Diagnosis not present

## 2021-11-06 DIAGNOSIS — Z7984 Long term (current) use of oral hypoglycemic drugs: Secondary | ICD-10-CM | POA: Insufficient documentation

## 2021-11-06 DIAGNOSIS — N939 Abnormal uterine and vaginal bleeding, unspecified: Secondary | ICD-10-CM | POA: Diagnosis present

## 2021-11-06 DIAGNOSIS — N840 Polyp of corpus uteri: Secondary | ICD-10-CM | POA: Diagnosis not present

## 2021-11-06 DIAGNOSIS — E7800 Pure hypercholesterolemia, unspecified: Secondary | ICD-10-CM | POA: Diagnosis not present

## 2021-11-06 DIAGNOSIS — D250 Submucous leiomyoma of uterus: Secondary | ICD-10-CM | POA: Diagnosis not present

## 2021-11-06 DIAGNOSIS — E282 Polycystic ovarian syndrome: Secondary | ICD-10-CM | POA: Diagnosis not present

## 2021-11-06 DIAGNOSIS — F419 Anxiety disorder, unspecified: Secondary | ICD-10-CM | POA: Diagnosis not present

## 2021-11-06 HISTORY — DX: Polyp of corpus uteri: N84.0

## 2021-11-06 HISTORY — DX: Presence of spectacles and contact lenses: Z97.3

## 2021-11-06 HISTORY — DX: Prediabetes: R73.03

## 2021-11-06 HISTORY — PX: DILATATION & CURETTAGE/HYSTEROSCOPY WITH MYOSURE: SHX6511

## 2021-11-06 HISTORY — DX: Benign neoplasm of connective and other soft tissue, unspecified: D21.9

## 2021-11-06 HISTORY — DX: Anxiety disorder, unspecified: F41.9

## 2021-11-06 HISTORY — DX: Hyperlipidemia, unspecified: E78.5

## 2021-11-06 LAB — POCT PREGNANCY, URINE: Preg Test, Ur: NEGATIVE

## 2021-11-06 SURGERY — DILATATION & CURETTAGE/HYSTEROSCOPY WITH MYOSURE
Anesthesia: General

## 2021-11-06 MED ORDER — MIDAZOLAM HCL 2 MG/2ML IJ SOLN
INTRAMUSCULAR | Status: DC | PRN
  Administered 2021-11-06: 2 mg via INTRAVENOUS

## 2021-11-06 MED ORDER — LIDOCAINE HCL 1 % IJ SOLN
INTRAMUSCULAR | Status: DC | PRN
  Administered 2021-11-06: 10 mL

## 2021-11-06 MED ORDER — LACTATED RINGERS IV SOLN: INTRAVENOUS | Status: DC

## 2021-11-06 MED ORDER — ACETAMINOPHEN 500 MG PO TABS
ORAL_TABLET | ORAL | Status: AC
  Filled 2021-11-06: qty 2

## 2021-11-06 MED ORDER — FENTANYL CITRATE (PF) 100 MCG/2ML IJ SOLN
INTRAMUSCULAR | Status: AC
  Filled 2021-11-06: qty 2

## 2021-11-06 MED ORDER — POVIDONE-IODINE 10 % EX SWAB: 2.0000 "application " | Freq: Once | CUTANEOUS | Status: DC

## 2021-11-06 MED ORDER — FENTANYL CITRATE (PF) 100 MCG/2ML IJ SOLN
INTRAMUSCULAR | Status: DC | PRN
Start: 2021-11-06 — End: 2021-11-06
  Administered 2021-11-06 (×2): 25 ug via INTRAVENOUS
  Administered 2021-11-06: 50 ug via INTRAVENOUS

## 2021-11-06 MED ORDER — KETOROLAC TROMETHAMINE 30 MG/ML IJ SOLN
INTRAMUSCULAR | Status: DC | PRN
  Administered 2021-11-06: 30 mg via INTRAVENOUS

## 2021-11-06 MED ORDER — OXYCODONE HCL 5 MG PO TABS: 5.0000 mg | ORAL_TABLET | Freq: Four times a day (QID) | ORAL | 0 refills | Status: DC | PRN

## 2021-11-06 MED ORDER — PROPOFOL 10 MG/ML IV BOLUS
INTRAVENOUS | Status: DC | PRN
  Administered 2021-11-06: 200 mg via INTRAVENOUS

## 2021-11-06 MED ORDER — LIDOCAINE HCL (CARDIAC) PF 100 MG/5ML IV SOSY
PREFILLED_SYRINGE | INTRAVENOUS | Status: DC | PRN
  Administered 2021-11-06: 50 mg via INTRAVENOUS

## 2021-11-06 MED ORDER — SODIUM CHLORIDE 0.9 % IR SOLN
Status: DC | PRN
  Administered 2021-11-06: 3000 mL
  Administered 2021-11-06: 1000 mL

## 2021-11-06 MED ORDER — IBUPROFEN 800 MG PO TABS: 800.0000 mg | ORAL_TABLET | Freq: Three times a day (TID) | ORAL | 1 refills | Status: DC | PRN

## 2021-11-06 MED ORDER — DEXAMETHASONE SODIUM PHOSPHATE 4 MG/ML IJ SOLN
INTRAMUSCULAR | Status: DC | PRN
  Administered 2021-11-06: 5 mg via INTRAVENOUS

## 2021-11-06 MED ORDER — MIDAZOLAM HCL 2 MG/2ML IJ SOLN
INTRAMUSCULAR | Status: AC
  Filled 2021-11-06: qty 2

## 2021-11-06 MED ORDER — ONDANSETRON HCL 4 MG/2ML IJ SOLN
INTRAMUSCULAR | Status: DC | PRN
  Administered 2021-11-06: 4 mg via INTRAVENOUS

## 2021-11-06 MED ORDER — ACETAMINOPHEN 500 MG PO TABS
1000.0000 mg | ORAL_TABLET | ORAL | Status: AC
  Administered 2021-11-06: 1000 mg via ORAL

## 2021-11-06 SURGICAL SUPPLY — 18 items
CATH ROBINSON RED A/P 16FR (CATHETERS) ×2 IMPLANT
DEVICE MYOSURE LITE (MISCELLANEOUS) IMPLANT
DEVICE MYOSURE REACH (MISCELLANEOUS) ×1 IMPLANT
DILATOR CANAL MILEX (MISCELLANEOUS) IMPLANT
DRSG TELFA 3X8 NADH (GAUZE/BANDAGES/DRESSINGS) ×2 IMPLANT
GAUZE 4X4 16PLY ~~LOC~~+RFID DBL (SPONGE) ×4 IMPLANT
GLOVE BIO SURGEON STRL SZ 6.5 (GLOVE) ×2 IMPLANT
GLOVE BIOGEL PI IND STRL 7.0 (GLOVE) ×2 IMPLANT
GLOVE BIOGEL PI INDICATOR 7.0 (GLOVE) ×2
GOWN STRL REUS W/TWL LRG LVL3 (GOWN DISPOSABLE) ×2 IMPLANT
IV NS IRRIG 3000ML ARTHROMATIC (IV SOLUTION) ×2 IMPLANT
KIT PROCEDURE FLUENT (KITS) ×2 IMPLANT
KIT TURNOVER CYSTO (KITS) ×2 IMPLANT
PACK VAGINAL MINOR WOMEN LF (CUSTOM PROCEDURE TRAY) ×2 IMPLANT
PAD DRESSING TELFA 3X8 NADH (GAUZE/BANDAGES/DRESSINGS) ×1 IMPLANT
PAD OB MATERNITY 4.3X12.25 (PERSONAL CARE ITEMS) ×2 IMPLANT
SEAL CERVICAL OMNI LOK (ABLATOR) IMPLANT
SEAL ROD LENS SCOPE MYOSURE (ABLATOR) ×2 IMPLANT

## 2021-11-06 NOTE — Anesthesia Postprocedure Evaluation (Signed)
Anesthesia Post Note ? ?Patient: Margaret Hahn ? ?Procedure(s) Performed: [place] ? ?  ? ?Patient location during evaluation: PACU ?Anesthesia Type: General ?Level of consciousness: awake ?Pain management: pain level controlled ?Vital Signs Assessment: post-procedure vital signs reviewed and stable ?Respiratory status: spontaneous breathing, nonlabored ventilation, respiratory function stable and patient connected to nasal cannula oxygen ?Cardiovascular status: blood pressure returned to baseline and stable ?Postop Assessment: no apparent nausea or vomiting ?Anesthetic complications: no ? ? ?No notable events documented. ? ?Last Vitals:  ?Vitals:  ? 11/06/21 1000 11/06/21 1045  ?BP: 123/77 125/87  ?Pulse: 79 83  ?Resp: 18 16  ?Temp:  36.9 ?C  ?SpO2: 99% 99%  ?  ?Last Pain:  ?Vitals:  ? 11/06/21 1045  ?TempSrc:   ?PainSc: 0-No pain  ? ? ?  ?  ?  ?  ?  ?  ? ?Aliese Brannum P Keandrea Tapley ? ? ? ? ?

## 2021-11-06 NOTE — Brief Op Note (Signed)
11/06/2021 ? ?9:24 AM ? ?PATIENT:  Margaret Hahn  36 y.o. female ? ?PRE-OPERATIVE DIAGNOSIS:  uterine leiomyoma ? ?POST-OPERATIVE DIAGNOSIS:  uterine polyps ? ?PROCEDURE:  Procedure(s) with comments: ?DILATATION & CURETTAGE/HYSTEROSCOPY WITH MYOSURE (N/A) - extra 15 mins for MD ? ?SURGEON:  Surgeon(s) and Role: ?   * Bovard-Stuckert, Jeral Fruit, MD - Primary ? ?ANESTHESIA:   local and general by LMA ? ?EBL:  30 mL IVF and uop per anesthesia ? ?DRAINS: none  ? ?LOCAL MEDICATIONS USED:  LIDOCAINE  ? ?SPECIMEN:  Source of Specimen:  endometrial curetting and polyps ? ?DISPOSITION OF SPECIMEN:  PATHOLOGY ? ?COUNTS:  YES ? ?TOURNIQUET:  * No tourniquets in log * ? ?DICTATION: .Other Dictation: Dictation Number [id number] ? ?PLAN OF CARE: Discharge to home after PACU ? ?PATIENT DISPOSITION:  PACU - hemodynamically stable. ?  ?Delay start of Pharmacological VTE agent (>24hrs) due to surgical blood loss or risk of bleeding: not applicable ? ?

## 2021-11-06 NOTE — Anesthesia Preprocedure Evaluation (Addendum)
Anesthesia Evaluation  ?Patient identified by MRN, date of birth, ID band ?Patient awake ? ? ? ?Reviewed: ?Allergy & Precautions, NPO status , Patient's Chart, lab work & pertinent test results ? ?Airway ?Mallampati: II ? ?TM Distance: >3 FB ?Neck ROM: Full ? ? ? Dental ? ?(+) Chipped, Missing,  ?  ?Pulmonary ?neg pulmonary ROS,  ?  ?Pulmonary exam normal ? ? ? ? ? ? ? Cardiovascular ?hypertension, Pt. on medications ? ?Rhythm:Regular Rate:Tachycardia ? ?ECG: SR, rate 91 ?  ?Neuro/Psych ?Anxiety negative neurological ROS ?   ? GI/Hepatic ?negative GI ROS, Neg liver ROS,   ?Endo/Other  ?Morbid obesityPCOS (polycystic ovarian syndrome) ? Renal/GU ?negative Renal ROS  ? ?  ?Musculoskeletal ?negative musculoskeletal ROS ?(+)  ? Abdominal ?(+) + obese,   ?Peds ? Hematology ?negative hematology ROS ?(+)   ?Anesthesia Other Findings ?uterine leiomyoma ? Reproductive/Obstetrics ?hcg negative ? ?  ? ? ? ? ? ? ? ? ? ? ? ? ? ?  ?  ? ? ? ? ? ? ? ?Anesthesia Physical ?Anesthesia Plan ? ?ASA: 3 ? ?Anesthesia Plan: General  ? ?Post-op Pain Management:   ? ?Induction: Intravenous ? ?PONV Risk Score and Plan: 4 or greater and Ondansetron, Propofol infusion, Dexamethasone, Midazolam and Treatment may vary due to age or medical condition ? ?Airway Management Planned: Oral ETT ? ?Additional Equipment:  ? ?Intra-op Plan:  ? ?Post-operative Plan: Extubation in OR ? ?Informed Consent: I have reviewed the patients History and Physical, chart, labs and discussed the procedure including the risks, benefits and alternatives for the proposed anesthesia with the patient or authorized representative who has indicated his/her understanding and acceptance.  ? ? ? ?Dental advisory given ? ?Plan Discussed with: CRNA ? ?Anesthesia Plan Comments:   ? ? ? ? ? ?Anesthesia Quick Evaluation ? ?

## 2021-11-06 NOTE — Op Note (Signed)
NAME: Margaret Hahn, Margaret Hahn ?MEDICAL RECORD NO: [id number] ?ACCOUNT NO: [id number] ?DATE OF BIRTH: [birth year] ?FACILITY: [facility] ?LOCATION: [facility] ?PHYSICIAN: Janyth Contes, MD ? ?Operative Report  ? ?DATE OF PROCEDURE: 11/06/2021 ? ?PREOPERATIVE DIAGNOSIS:  Submucosal fibroid by outside ultrasound. ? ?POSTOPERATIVE DIAGNOSIS:  Uterine polyps. ? ?PROCEDURE:  Hysteroscopy, D and C with MyoSure. ? ?SURGEON:  Janyth Contes, MD ? ?ANESTHESIA:  Local and general by LMA. ? ?ESTIMATED BLOOD LOSS:  30 mL ? ?IV FLUID AND URINE OUTPUT:  Per Anesthesia. ? ?COMPLICATIONS:  None. ? ?PATHOLOGY:  Endometrial curettings and polyps. ? ?DESCRIPTION OF PROCEDURE:  After informed consent was reviewed with the patient, including risks, benefits and alternatives of surgical procedure, she was transported to the operating room and placed on the table in supine position.  General anesthesia  ?was induced and found to be adequate.  She was then placed in the [place] stirrups, prepped and draped in the normal sterile fashion.  Her bladder was sterilely drained.  Using an open-sided speculum, her cervix was easily visualized.  A paracervical  ?block was placed with 10 mL of 1% lidocaine.  The anterior lip of her cervix was grasped with a single tooth tenaculum.  Uterus was sounded to 9 cm.  Cervix was dilated to 21-French.  Hysteroscope was introduced into the uterine cavity, revealing  ?multiple uterine polyps. No fibroids were seen in the cavity, decision was made to proceed with the MyoSure for treatment of the polyps.  The same polyps were removed with the MyoSure device.  A sharp D and C was then performed.  The patient tolerated  ?the procedure well.  Her cavity looked clear at the end of this.  The deficit was thought to be less than 250 mL.  The instruments were removed from her vagina. The anterior lip of the cervix was hemostatic.  She was awakened and returned to supine  ?position, tolerated the procedure well.  Sponge, lap and  needle counts were correct x2 per the operating staff. ? ? ?PUS ?D: 11/06/2021 9:29:11 am T: 11/06/2021 10:59:00 am  ?JOB: [id number]/ [id number]  ?

## 2021-11-06 NOTE — Discharge Instructions (Signed)

## 2021-11-06 NOTE — Anesthesia Procedure Notes (Signed)
Procedure Name: LMA Insertion ?Date/Time: 11/06/2021 8:37 AM ?Performed by: Georgeanne Nim, CRNA ?Pre-anesthesia Checklist: Patient identified, Emergency Drugs available, Suction available, Patient being monitored and Timeout performed ?Patient Re-evaluated:Patient Re-evaluated prior to induction ?Oxygen Delivery Method: Circle system utilized ?Preoxygenation: Pre-oxygenation with 100% oxygen ?Induction Type: IV induction ?Ventilation: Mask ventilation without difficulty ?LMA: LMA with gastric port inserted ?LMA Size: 4.0 ?Number of attempts: 1 ?Placement Confirmation: breath sounds checked- equal and bilateral, CO2 detector and positive ETCO2 ?Tube secured with: Tape ?Dental Injury: Teeth and Oropharynx as per pre-operative assessment  ? ? ? ? ?

## 2021-11-06 NOTE — Transfer of Care (Signed)
Immediate Anesthesia Transfer of Care Note ? ?Patient: Margaret Hahn ? ?Procedure(s) Performed: [place] ? ?Patient Location: PACU ? ?Anesthesia Type:General ? ?Level of Consciousness: awake, alert , oriented and patient cooperative ? ?Airway & Oxygen Therapy: Patient Spontanous Breathing and Patient connected to face mask oxygen ? ?Post-op Assessment: Report given to RN and Post -op Vital signs reviewed and stable ? ?Post vital signs: Reviewed and stable ? ?Last Vitals:  ?Vitals Value Taken Time  ?BP 125/88 11/06/21 0915  ?Temp    ?Pulse 112 11/06/21 0916  ?Resp 14 11/06/21 0916  ?SpO2 100 % 11/06/21 0916  ?Vitals shown include unvalidated device data. ? ?Last Pain:  ?Vitals:  ? 11/06/21 0651  ?TempSrc: Oral  ?PainSc: 3   ?   ? ?Patients Stated Pain Goal: 7 (11/06/21 9758) ? ?Complications: No notable events documented. ?

## 2021-11-06 NOTE — Interval H&P Note (Signed)
History and Physical Interval Note: ? ?11/06/2021 ?8:18 AM ? ?Margaret Hahn  has presented today for surgery, with the diagnosis of uterine leiomyoma.  The various methods of treatment have been discussed with the patient and family. After consideration of risks, benefits and other options for treatment, the patient has consented to  Procedure(s) with comments: ?DILATATION & CURETTAGE/HYSTEROSCOPY WITH MYOSURE (N/A) - extra 15 mins for MD as a surgical intervention.  The patient's history has been reviewed, patient examined, no change in status, stable for surgery.  I have reviewed the patient's chart and labs.  Questions were answered to the patient's satisfaction.   ? ? ?[name] ? ? ?

## 2021-11-07 LAB — TYPE AND SCREEN
ABO/RH(D): A POS
Antibody Screen: NEGATIVE

## 2021-11-09 ENCOUNTER — Encounter (HOSPITAL_BASED_OUTPATIENT_CLINIC_OR_DEPARTMENT_OTHER): Payer: Self-pay | Admitting: Obstetrics and Gynecology

## 2021-11-09 LAB — SURGICAL PATHOLOGY

## 2021-11-19 DIAGNOSIS — R300 Dysuria: Secondary | ICD-10-CM | POA: Diagnosis not present

## 2021-11-19 DIAGNOSIS — N760 Acute vaginitis: Secondary | ICD-10-CM | POA: Diagnosis not present

## 2021-11-19 DIAGNOSIS — N9089 Other specified noninflammatory disorders of vulva and perineum: Secondary | ICD-10-CM | POA: Diagnosis not present

## 2022-01-19 DIAGNOSIS — N912 Amenorrhea, unspecified: Secondary | ICD-10-CM | POA: Diagnosis not present

## 2022-01-28 DIAGNOSIS — E282 Polycystic ovarian syndrome: Secondary | ICD-10-CM | POA: Diagnosis not present

## 2022-01-28 DIAGNOSIS — N970 Female infertility associated with anovulation: Secondary | ICD-10-CM | POA: Diagnosis not present

## 2022-01-28 DIAGNOSIS — D259 Leiomyoma of uterus, unspecified: Secondary | ICD-10-CM | POA: Diagnosis not present

## 2022-01-28 DIAGNOSIS — N926 Irregular menstruation, unspecified: Secondary | ICD-10-CM | POA: Diagnosis not present

## 2022-03-04 DIAGNOSIS — R300 Dysuria: Secondary | ICD-10-CM | POA: Diagnosis not present

## 2022-03-04 DIAGNOSIS — Z113 Encounter for screening for infections with a predominantly sexual mode of transmission: Secondary | ICD-10-CM | POA: Diagnosis not present

## 2022-03-04 DIAGNOSIS — N898 Other specified noninflammatory disorders of vagina: Secondary | ICD-10-CM | POA: Diagnosis not present

## 2022-03-18 DIAGNOSIS — N898 Other specified noninflammatory disorders of vagina: Secondary | ICD-10-CM | POA: Diagnosis not present

## 2022-04-22 DIAGNOSIS — E119 Type 2 diabetes mellitus without complications: Secondary | ICD-10-CM | POA: Diagnosis not present

## 2022-04-22 DIAGNOSIS — I10 Essential (primary) hypertension: Secondary | ICD-10-CM | POA: Diagnosis not present

## 2022-04-22 DIAGNOSIS — E282 Polycystic ovarian syndrome: Secondary | ICD-10-CM | POA: Diagnosis not present

## 2022-04-22 DIAGNOSIS — M79604 Pain in right leg: Secondary | ICD-10-CM | POA: Diagnosis not present

## 2022-05-10 DIAGNOSIS — N898 Other specified noninflammatory disorders of vagina: Secondary | ICD-10-CM | POA: Diagnosis not present

## 2022-05-10 DIAGNOSIS — N9089 Other specified noninflammatory disorders of vulva and perineum: Secondary | ICD-10-CM | POA: Diagnosis not present

## 2022-05-21 ENCOUNTER — Ambulatory Visit
Admission: EM | Admit: 2022-05-21 | Discharge: 2022-05-21 | Disposition: A | Discharge status: Home / Self Care | Payer: BC Managed Care – PPO | Attending: Physician Assistant | Admitting: Physician Assistant

## 2022-05-21 DIAGNOSIS — H1032 Unspecified acute conjunctivitis, left eye: Secondary | ICD-10-CM | POA: Diagnosis not present

## 2022-05-21 DIAGNOSIS — H579 Unspecified disorder of eye and adnexa: Secondary | ICD-10-CM

## 2022-05-21 MED ORDER — POLYMYXIN B-TRIMETHOPRIM 10000-0.1 UNIT/ML-% OP SOLN: 1.0000 [drp] | OPHTHALMIC | 0 refills | Status: AC

## 2022-05-21 MED ORDER — METHYLPREDNISOLONE SODIUM SUCC 125 MG IJ SOLR
80.0000 mg | Freq: Once | INTRAMUSCULAR | Status: AC
Start: 2022-05-21 — End: 2022-05-21
  Administered 2022-05-21: 80 mg via INTRAMUSCULAR

## 2022-05-21 NOTE — ED Triage Notes (Addendum)
Pt presents to [facility] with co of eye swelling and pain from a lash appointment yesterday. Pt st she has done lashes before but had a new tech yesterday, pt has taken lashes off and washes face. Pt has taken benadryl and zyrtec and motrin

## 2022-05-21 NOTE — ED Provider Notes (Signed)
[facility]    CSN: [id number] Arrival date & time: 05/21/22  0810      History   Chief Complaint Chief Complaint  Patient presents with   Allergic Reaction    HPI Margaret Hahn is a 36 y.o. female.   Patient here today for evaluation of bilateral eyelid swelling that started yesterday.  She reports that she had a last appointment but had a different tech than she typically does.  She notes when she noticed swelling she did take the lashes off and wash her face however she has continued to have swelling and discomfort to upper eyelids.  She has been taking Benadryl and Zyrtec as well as Motrin without resolution.  She denies any trouble breathing or shortness of breath.  She does have some mild redness to her left eye with tearing noted.  Denies any vision changes.  The history is provided by the patient.    Past Medical History:  Diagnosis Date   Abnormal uterine bleeding (AUB) 11/04/2021   Anxiety    no meds taken   Hyperlipidemia    Hypertension    Leiomyoma of uterus 11/04/2021   Pain in right leg @ times due to uterine fibroids 11/04/2021   Pelvic pain 11/04/2021   Pre-diabetes    takes metformin   uterine fibroids    Uterine polyp 11/06/2021   Wears glasses     Patient Active Problem List   Diagnosis Date Noted   Uterine polyp 11/06/2021   Abnormal uterine bleeding (AUB) 11/04/2021   Leiomyoma of uterus 11/04/2021   Pelvic pain 11/04/2021   PCOS (polycystic ovarian syndrome) 04/25/2013   Metrorrhagia 04/25/2013   HTN (hypertension) 04/25/2013    Past Surgical History:  Procedure Laterality Date   CHOLECYSTECTOMY, LAPAROSCOPIC     2011 or 2012   [place] N/A 11/06/2021   Procedure: DILATATION & CURETTAGE/HYSTEROSCOPY WITH MYOSURE;  Surgeon: Janyth Contes, MD;  Location: [facility];  Service: Gynecology;  Laterality: N/A;  extra 15 mins for MD   LAPAROSCOPIC APPENDECTOMY  2006    MULTIPLE TOOTH EXTRACTIONS  04/2021   2 botton teeth    OB History   No obstetric history on file.      Home Medications    Prior to Admission medications   Medication Sig Start Date End Date Taking? Authorizing Provider  trimethoprim-polymyxin b (POLYTRIM) ophthalmic solution Place 1 drop into the left eye every 4 (four) hours for 7 days. 05/21/22 05/28/22 Yes Francene Finders, PA-C  amLODipine (NORVASC) 5 MG tablet Take 5 mg by mouth daily.    [provider]  cetirizine (ZYRTEC) 10 MG tablet Take 10 mg by mouth daily.    [provider]  ibuprofen (ADVIL) 800 MG tablet Take 1 tablet (800 mg total) by mouth every 8 (eight) hours as needed for moderate pain. 11/06/21   Bovard-Stuckert, Jeral Fruit, MD  lisinopril-hydrochlorothiazide (PRINZIDE,ZESTORETIC) 20-25 MG tablet Take 1 tablet by mouth daily. 07/25/15   [provider]  metFORMIN (GLUCOPHAGE) 500 MG tablet Take by mouth daily with breakfast.    [provider]  oxyCODONE (ROXICODONE) 5 MG immediate release tablet Take 1 tablet (5 mg total) by mouth every 6 (six) hours as needed for severe pain. 11/06/21   Bovard-Stuckert, Jeral Fruit, MD  rosuvastatin (CRESTOR) 5 MG tablet Take 2.5 mg by mouth daily.    [provider]    Family History Family History  Problem Relation Age of Onset   Diabetes Mother  Social History Social History   Tobacco Use   Smoking status: Never   Smokeless tobacco: Never  Vaping Use   Vaping Use: Former   Start date: 10/26/2021   Substances: Flavoring  Substance Use Topics   Alcohol use: Yes    Comment: occ wine   Drug use: Yes    Types: Marijuana    Comment: marijuana last used 1 and 1/2 yrs ago per pt 11-04-2021     Allergies   Flagyl [metronidazole]   Review of Systems Review of Systems  Constitutional:  Negative for chills and fever.  HENT:  Positive for facial swelling. Negative for congestion.   Eyes:  Positive for discharge, redness and  itching. Negative for pain and visual disturbance.  Respiratory:  Negative for shortness of breath.   Gastrointestinal:  Negative for nausea and vomiting.     Physical Exam Triage Vital Signs ED Triage Vitals  Enc Vitals Group     BP      Pulse      Resp      Temp      Temp src      SpO2      Weight      Height      Head Circumference      Peak Flow      Pain Score      Pain Loc      Pain Edu?      Excl. in [place]?    No data found.  Updated Vital Signs BP 119/84   Pulse 80   Temp 98 F (36.7 C)   Resp 19   SpO2 98%      Physical Exam Vitals and nursing note reviewed.  Constitutional:      General: She is not in acute distress.    Appearance: Normal appearance. She is not ill-appearing.  HENT:     Head: Normocephalic and atraumatic.     Nose: Nose normal. No congestion or rhinorrhea.  Eyes:     Comments: Bilateral upper eyelids mildly erythematous and edematous, minimal swelling noted to the lower lids as well. Left conjunctiva mildly injected, right conjunctiva WNL  Cardiovascular:     Rate and Rhythm: Normal rate.  Pulmonary:     Effort: Pulmonary effort is normal.  Neurological:     Mental Status: She is alert.  Psychiatric:        Mood and Affect: Mood normal.        Behavior: Behavior normal.        Thought Content: Thought content normal.      UC Treatments / Results  Labs (all labs ordered are listed, but only abnormal results are displayed) Labs Reviewed - No data to display  EKG   Radiology No results found.  Procedures Procedures (including critical care time)  Medications Ordered in UC Medications  methylPREDNISolone sodium succinate (SOLU-MEDROL) 125 mg/2 mL injection 80 mg (80 mg Intramuscular Given 05/21/22 0843)    Initial Impression / Assessment and Plan / UC Course  I have reviewed the triage vital signs and the nursing notes.  Pertinent labs & imaging results that were available during my care of the patient were reviewed  by me and considered in my medical decision making (see chart for details).   Discussed options for treatment of suspected allergic reaction- steroid injection in office vs oral tablets outpatient- Patient prefers injection. Solumedrol administered in office and polytrim drops prescribed given concerns for secondary conjunctivitis. Encouraged cold compresses for swelling and  follow up if no gradual improvement or with any further concerns.    Final Clinical Impressions(s) / UC Diagnoses   Final diagnoses:  Allergic eye reaction  Acute conjunctivitis of left eye, unspecified acute conjunctivitis type   Discharge Instructions   None    ED Prescriptions     Medication Sig Dispense Auth. Provider   trimethoprim-polymyxin b (POLYTRIM) ophthalmic solution Place 1 drop into the left eye every 4 (four) hours for 7 days. 10 mL Francene Finders, PA-C      PDMP not reviewed this encounter.   Francene Finders, PA-C 05/21/22 [phone]

## 2022-06-21 DIAGNOSIS — Z202 Contact with and (suspected) exposure to infections with a predominantly sexual mode of transmission: Secondary | ICD-10-CM | POA: Diagnosis not present

## 2022-06-21 DIAGNOSIS — Z113 Encounter for screening for infections with a predominantly sexual mode of transmission: Secondary | ICD-10-CM | POA: Diagnosis not present

## 2022-06-21 DIAGNOSIS — Z01419 Encounter for gynecological examination (general) (routine) without abnormal findings: Secondary | ICD-10-CM | POA: Diagnosis not present

## 2022-06-21 DIAGNOSIS — N926 Irregular menstruation, unspecified: Secondary | ICD-10-CM | POA: Diagnosis not present

## 2022-06-21 DIAGNOSIS — Z833 Family history of diabetes mellitus: Secondary | ICD-10-CM | POA: Diagnosis not present

## 2022-06-21 DIAGNOSIS — Z1389 Encounter for screening for other disorder: Secondary | ICD-10-CM | POA: Diagnosis not present

## 2022-06-21 DIAGNOSIS — R3129 Other microscopic hematuria: Secondary | ICD-10-CM | POA: Diagnosis not present

## 2022-06-21 DIAGNOSIS — N898 Other specified noninflammatory disorders of vagina: Secondary | ICD-10-CM | POA: Diagnosis not present

## 2022-06-21 DIAGNOSIS — D259 Leiomyoma of uterus, unspecified: Secondary | ICD-10-CM | POA: Diagnosis not present

## 2022-06-24 ENCOUNTER — Encounter (INDEPENDENT_AMBULATORY_CARE_PROVIDER_SITE_OTHER): Payer: Self-pay | Admitting: Family Medicine

## 2022-07-01 IMAGING — US US PELVIS COMPLETE TRANSABD/TRANSVAG W DUPLEX AND/OR DOPPLER
1 series · 15 of 25 positions shown · non-contrast
Comparison: Ultrasound 06/21/2016

CLINICAL DATA: Vaginal bleeding x1 month with pelvic pain.

EXAM:
TRANSABDOMINAL AND TRANSVAGINAL ULTRASOUND OF PELVIS
DOPPLER ULTRASOUND OF OVARIES
TECHNIQUE: Both transabdominal and transvaginal ultrasound examinations of the
pelvis were performed. Transabdominal technique was performed for
global imaging of the pelvis including uterus, ovaries, adnexal
regions, and pelvic cul-de-sac.
It was necessary to proceed with endovaginal exam following the
transabdominal exam to visualize the ovaries. Color and duplex
Doppler ultrasound was utilized to evaluate blood flow to the
ovaries.

[Series 1: us transvaginal non-ob mc & wl · 15 of 70 slices shown]
[im 1/70]
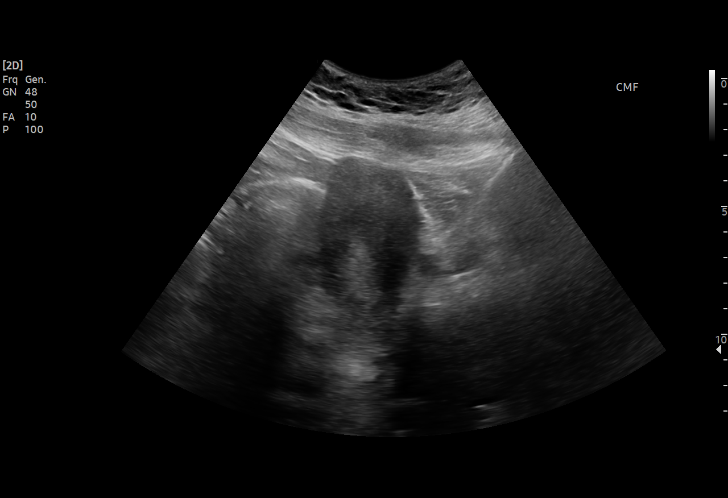
[im 6/70]
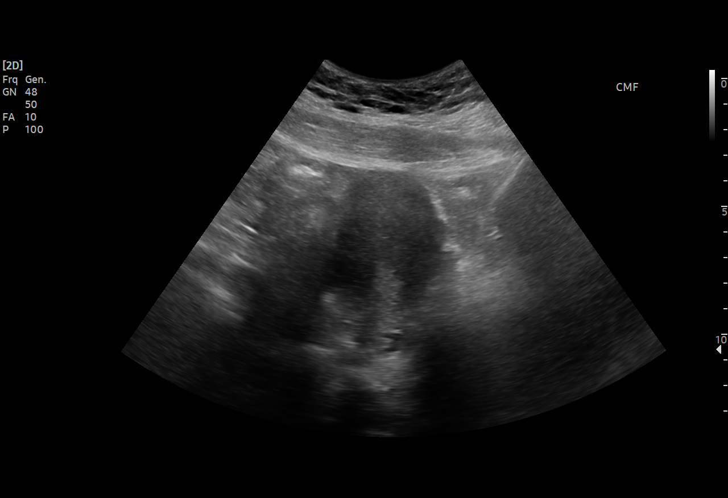
[im 12/70]
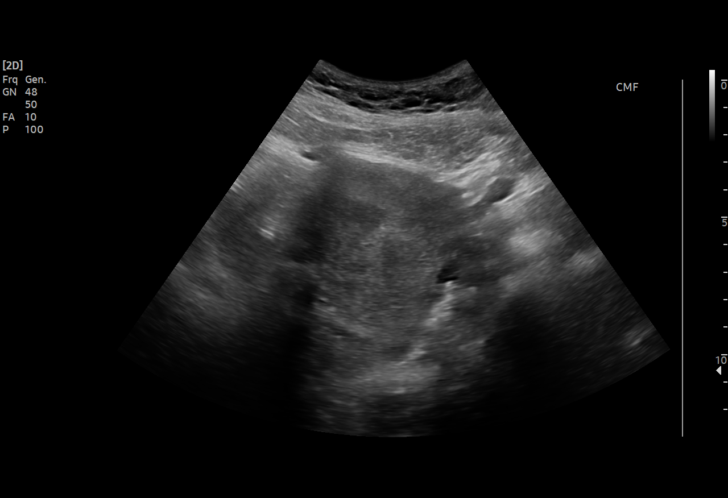
[im 15/70]
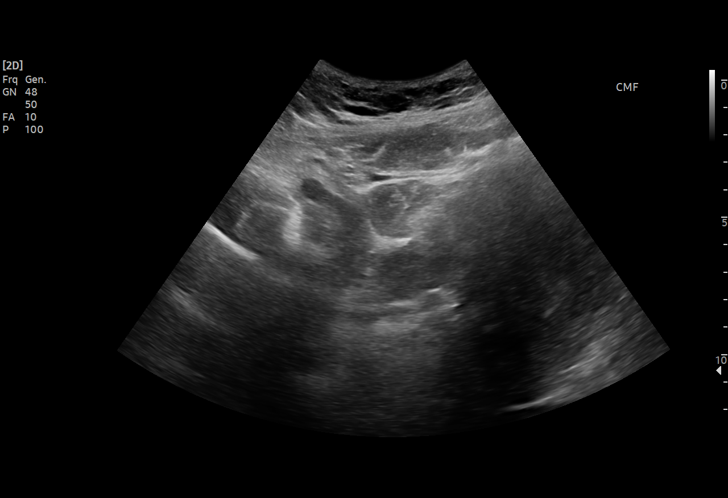
[im 21/70]
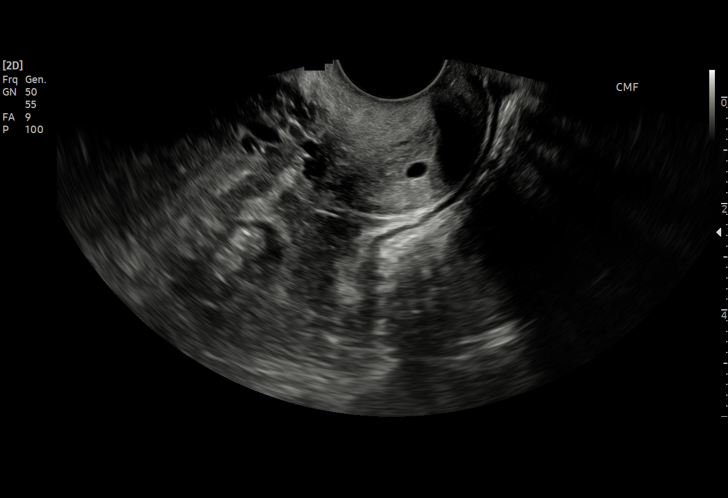
[im 26/70]
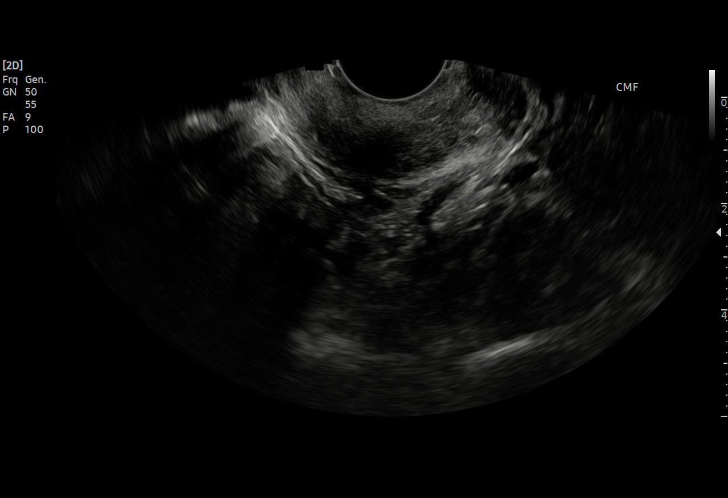
[im 29/70]
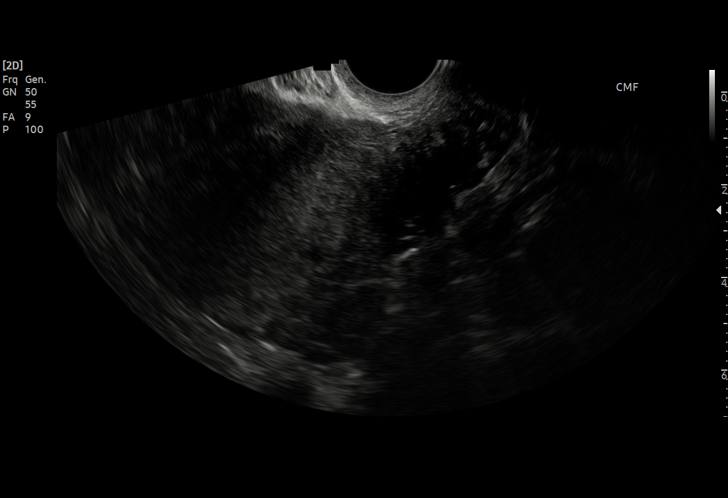
[im 35/70]
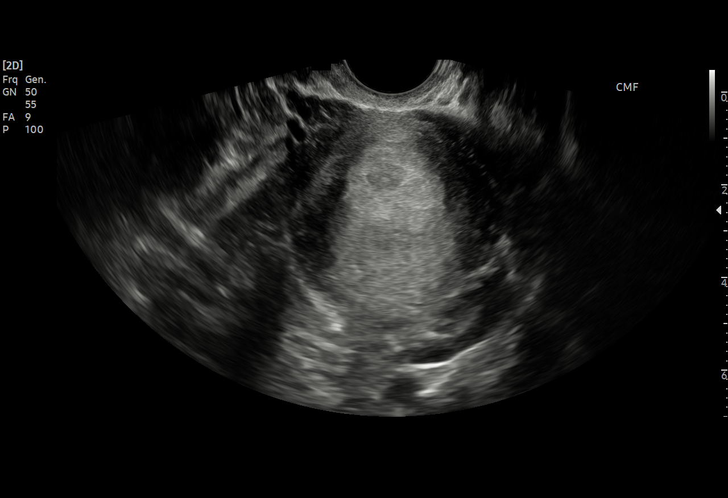
[im 41/70]
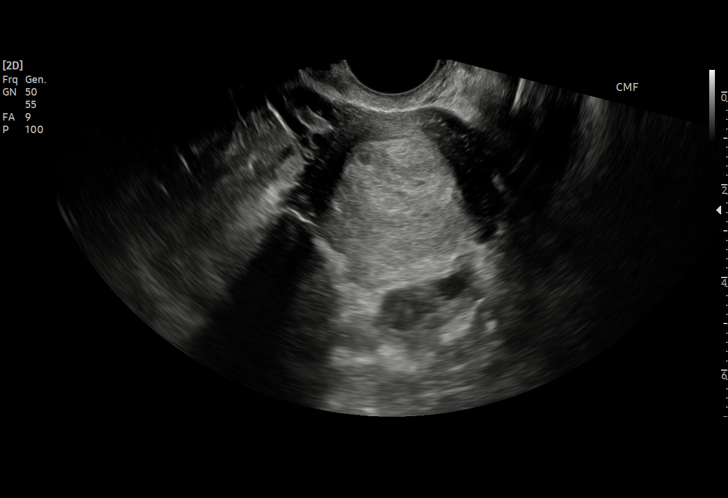
[im 44/70]
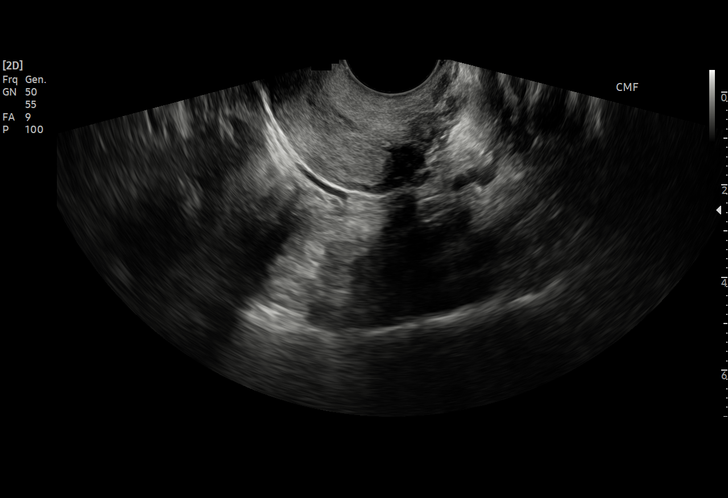
[im 49/70]
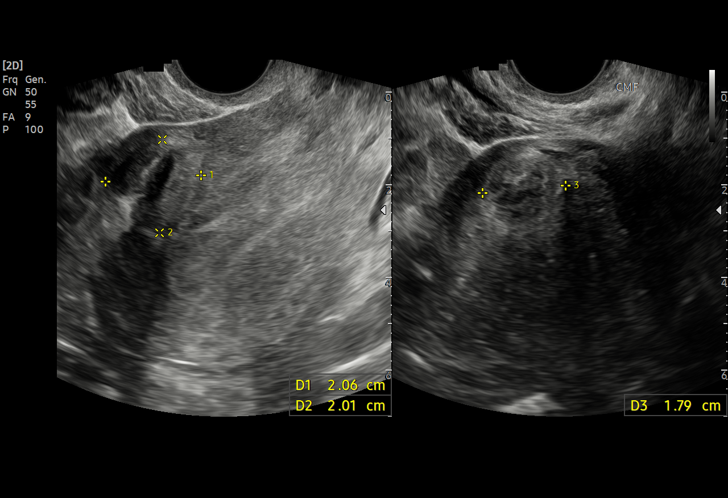
[im 55/70]
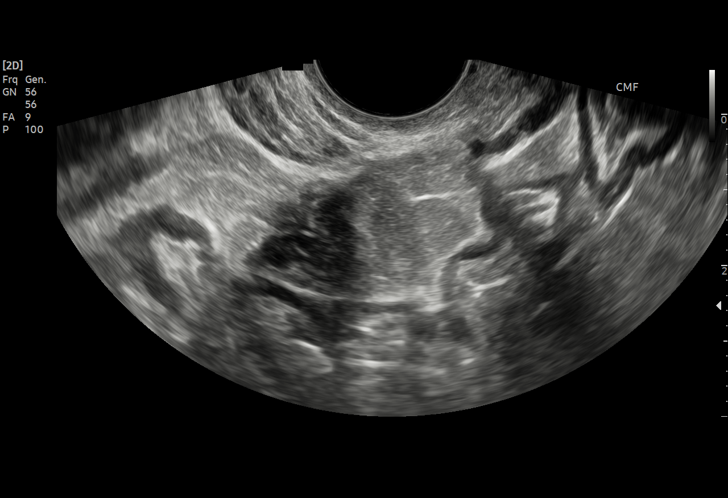
[im 58/70]
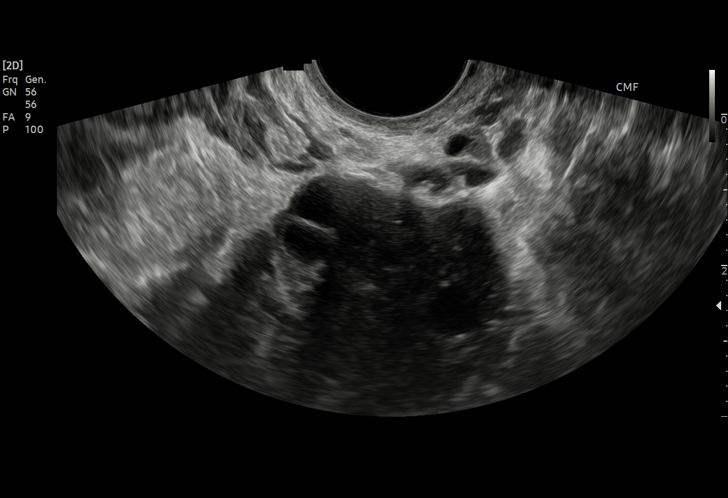
[im 64/70]
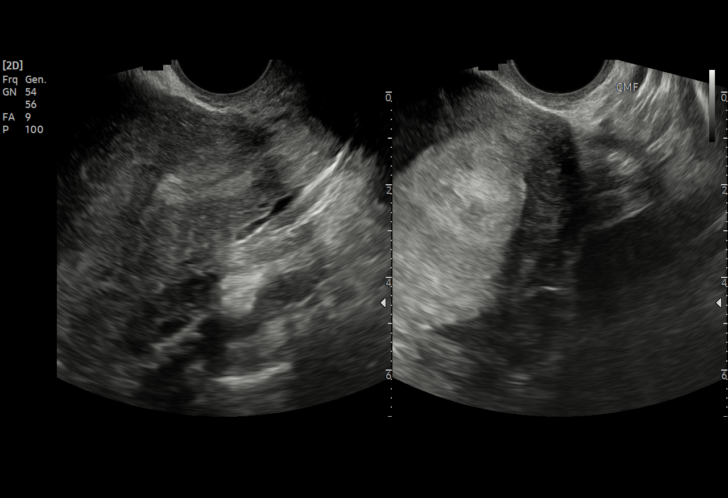
[im 70/70]
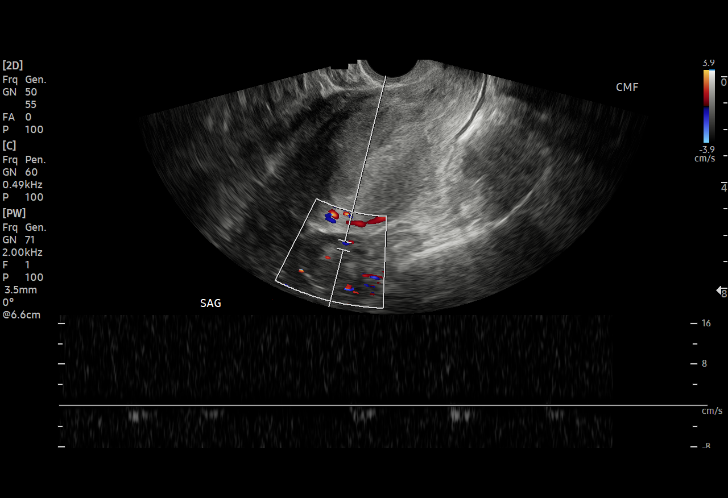

[15 of 25 positions shown; findings below may reference images not displayed]

FINDINGS: Uterus

Measurements: 8.8 x 5.8 x 4.8 cm = volume: 129 mL. There is a 2.1 x
2 x 1.8 cm intramural uterine lesion which likely reflects a
fibroid.

Endometrium

Thickness: 5.6 mm. Slightly heterogeneous in appearance but is
within normal limits for a premenopausal female.

Right ovary

Measurements: 1.9 x 1.8 x 1.5 cm = volume: 2.7 mL. Normal
appearance/no adnexal mass.

Left ovary

Measurements: 2.5 x 1.6 x 1.1 cm = volume: 2.2 mL. Normal
appearance/no adnexal mass.

Pulsed Doppler evaluation of both ovaries demonstrates normal
low-resistance arterial and venous waveforms.

Other findings

No abnormal free fluid.
IMPRESSION: 1. No acute abnormality. No sonographic evidence for ovarian
torsion.
2. Probable 2.1 cm intramural fibroid.
3. Slightly heterogeneous endometrium, which is within normal limits
for a premenopausal female. However, if abnormal uterine bleeding
persists despite hormone therapy consider further evaluation with
sonohysterogram for further evaluation, prior to hysteroscopy or
endometrial biopsy.

## 2022-07-09 DIAGNOSIS — N898 Other specified noninflammatory disorders of vagina: Secondary | ICD-10-CM | POA: Diagnosis not present

## 2022-09-06 DIAGNOSIS — Z113 Encounter for screening for infections with a predominantly sexual mode of transmission: Secondary | ICD-10-CM | POA: Diagnosis not present

## 2022-09-06 DIAGNOSIS — Z202 Contact with and (suspected) exposure to infections with a predominantly sexual mode of transmission: Secondary | ICD-10-CM | POA: Diagnosis not present

## 2022-09-06 DIAGNOSIS — N898 Other specified noninflammatory disorders of vagina: Secondary | ICD-10-CM | POA: Diagnosis not present

## 2022-09-06 DIAGNOSIS — N909 Noninflammatory disorder of vulva and perineum, unspecified: Secondary | ICD-10-CM | POA: Diagnosis not present

## 2022-09-07 DIAGNOSIS — E282 Polycystic ovarian syndrome: Secondary | ICD-10-CM | POA: Diagnosis not present

## 2022-09-07 DIAGNOSIS — Z8742 Personal history of other diseases of the female genital tract: Secondary | ICD-10-CM | POA: Diagnosis not present

## 2022-09-27 DIAGNOSIS — N898 Other specified noninflammatory disorders of vagina: Secondary | ICD-10-CM | POA: Diagnosis not present

## 2022-11-15 DIAGNOSIS — N9489 Other specified conditions associated with female genital organs and menstrual cycle: Secondary | ICD-10-CM | POA: Diagnosis not present

## 2022-11-15 DIAGNOSIS — R300 Dysuria: Secondary | ICD-10-CM | POA: Diagnosis not present

## 2022-11-15 DIAGNOSIS — N898 Other specified noninflammatory disorders of vagina: Secondary | ICD-10-CM | POA: Diagnosis not present

## 2022-11-15 DIAGNOSIS — R102 Pelvic and perineal pain: Secondary | ICD-10-CM | POA: Diagnosis not present

## 2023-01-05 ENCOUNTER — Encounter (INDEPENDENT_AMBULATORY_CARE_PROVIDER_SITE_OTHER): Payer: Self-pay | Admitting: Family Medicine

## 2023-01-25 ENCOUNTER — Ambulatory Visit: Payer: BC Managed Care – PPO | Admitting: Family Medicine

## 2023-01-25 ENCOUNTER — Encounter: Payer: Self-pay | Admitting: Family Medicine

## 2023-01-25 VITALS — BP 122/88 | HR 85 | Temp 98.9°F | Ht 64.75 in | Wt 313.6 lb

## 2023-01-25 DIAGNOSIS — R7303 Prediabetes: Secondary | ICD-10-CM

## 2023-01-25 DIAGNOSIS — Z6841 Body Mass Index (BMI) 40.0 and over, adult: Secondary | ICD-10-CM

## 2023-01-25 DIAGNOSIS — E119 Type 2 diabetes mellitus without complications: Secondary | ICD-10-CM | POA: Diagnosis not present

## 2023-01-25 DIAGNOSIS — I10 Essential (primary) hypertension: Secondary | ICD-10-CM | POA: Diagnosis not present

## 2023-01-25 LAB — HEMOGLOBIN A1C: Hgb A1c MFr Bld: 6.3 % (ref 4.6–6.5)

## 2023-01-25 MED ORDER — PHENTERMINE HCL 8 MG PO TABS: 1.0000 | ORAL_TABLET | Freq: Every day | ORAL | 0 refills | Status: DC

## 2023-01-25 NOTE — Progress Notes (Signed)
New Patient Office Visit  Subjective    Patient ID: Margaret Hahn, female    DOB: Oct 03, 1985  Age: 37 y.o. MRN: [id number]  CC:  Chief Complaint  Patient presents with   Establish Care    HPI Margaret Hahn presents to establish care Patient was formerly seen by Dr. Pecola Leisure, states that she does not have any current symptoms or issues to report today.  HTN -- BP in office performed and is well controlled. She  reports no side effects to the medications, no chest pain, SOB, dizziness or headaches. She has a BP cuff at home and is checking BP regularly, reports they are in the normal range.   Prediabetes- patient reports her last A1C was last year, is on metformin 500 mg BID. States that she is exercising and she is trying to change her diet. States that she does not eat red meat or pork. Has not had her A1C checked since last year.   Patient would like to discuss her diet and she is interested in losing weight. She reports she has tried many different diets in the past, is currently using intermittent fasting  to help reduce calories. States she is working out on a regular basis at [employer] Feels like it is not working well for her. States she used to use the calorie counting apps but felt like it didn't really help.     Current Outpatient Medications  Medication Instructions   amLODipine (NORVASC) 5 mg, Oral, Daily   cetirizine (ZYRTEC) 10 mg, Oral, Daily   ibuprofen (ADVIL) 800 mg, Oral, Every 8 hours PRN   lisinopril-hydrochlorothiazide (PRINZIDE,ZESTORETIC) 20-25 MG tablet 1 tablet, Oral, Daily   metFORMIN (GLUCOPHAGE) 500 MG tablet Oral, Daily with breakfast   [START ON 03/21/2023] Phentermine HCl 8 mg, Oral, Daily   [START ON 02/23/2023] Phentermine HCl 8 mg, Oral, Daily   Phentermine HCl 8 mg, Oral, Daily   rosuvastatin (CRESTOR) 2.5 mg, Oral, Daily    Past Medical History:  Diagnosis Date   Abnormal uterine bleeding (AUB) 11/04/2021   Anxiety    no meds taken   Hyperlipidemia     Hypertension    Leiomyoma of uterus 11/04/2021   Pain in right leg @ times due to uterine fibroids 11/04/2021   Pelvic pain 11/04/2021   Pre-diabetes    takes metformin   uterine fibroids    Uterine polyp 11/06/2021   Wears glasses     Past Surgical History:  Procedure Laterality Date   CHOLECYSTECTOMY, LAPAROSCOPIC     2011 or 2012   DILATATION & CURETTAGE/HYSTEROSCOPY WITH MYOSURE N/A 11/06/2021   Procedure: DILATATION & CURETTAGE/HYSTEROSCOPY WITH MYOSURE;  Surgeon: Sherian Rein, MD;  Location: [facility];  Service: Gynecology;  Laterality: N/A;  extra 15 mins for MD   LAPAROSCOPIC APPENDECTOMY  2006   MULTIPLE TOOTH EXTRACTIONS  04/2021   2 botton teeth    Family History  Problem Relation Age of Onset   Hyperlipidemia Mother    Diabetes Mother    Hyperlipidemia Father    Early death Father    CVA Father    Heart disease Brother    Diabetes Brother    Hyperlipidemia Maternal Grandmother    Asthma Maternal Grandmother    Cancer Paternal Grandmother    Heart attack Paternal Grandmother     Social History   Socioeconomic History   Marital status: Married    Spouse name: Not on file   Number of children: Not on file  Years of education: Not on file   Highest education level: Not on file  Occupational History   Not on file  Tobacco Use   Smoking status: Never   Smokeless tobacco: Never  Vaping Use   Vaping status: Former   Start date: 10/26/2021   Substances: Flavoring  Substance and Sexual Activity   Alcohol use: Yes   Drug use: Not Currently    Types: Marijuana    Comment: marijuana last used 1 and 1/2 yrs ago per pt 11-04-2021   Sexual activity: Yes    Birth control/protection: None  Other Topics Concern   Not on file  Social History Narrative   ** Merged History Encounter **       Social Determinants of Health   Financial Resource Strain: Not on file  Food Insecurity: Not on file  Transportation Needs: Not on file  Physical  Activity: Not on file  Stress: Not on file  Social Connections: Not on file  Intimate Partner Violence: Not on file    Review of Systems  All other systems reviewed and are negative.       Objective    BP 122/88 (BP Location: Left Arm, Patient Position: Sitting, Cuff Size: Large)   Pulse 85   Temp 98.9 F (37.2 C) (Oral)   Ht 5' 4.75" (1.645 m)   Wt (!) 313 lb 9.6 oz (142.2 kg)   LMP 01/04/2023 (Exact Date)   SpO2 98%   BMI 52.59 kg/m   Physical Exam Vitals reviewed.  Constitutional:      Appearance: Normal appearance. She is well-groomed. She is morbidly obese.  Eyes:     Conjunctiva/sclera: Conjunctivae normal.  Neck:     Thyroid: No thyromegaly.  Cardiovascular:     Rate and Rhythm: Normal rate and regular rhythm.     Pulses: Normal pulses.     Heart sounds: S1 normal and S2 normal.  Pulmonary:     Effort: Pulmonary effort is normal.     Breath sounds: Normal breath sounds and air entry.  Abdominal:     General: Bowel sounds are normal.  Musculoskeletal:     Right lower leg: No edema.     Left lower leg: No edema.  Neurological:     Mental Status: She is alert and oriented to person, place, and time. Mental status is at baseline.     Gait: Gait is intact.  Psychiatric:        Mood and Affect: Mood and affect normal.        Speech: Speech normal.        Behavior: Behavior normal.        Judgment: Judgment normal.         Assessment & Plan:  Primary hypertension Assessment & Plan: Current hypertension medications:       Sig   amLODipine (NORVASC) 5 MG tablet (Taking) Take 5 mg by mouth daily.   lisinopril-hydrochlorothiazide (PRINZIDE,ZESTORETIC) 20-25 MG tablet (Taking) Take 1 tablet by mouth daily.      Chronic, well controlled on the above medications, will continue these as prescribed.    Prediabetes Assessment & Plan: With PCOS, pt is currently on metformin 500 mg BID. Will recheck her A1C today to monitor this. She may be a candidate  for GLP therapy if it is elevated. Pt is already on a cholesterol medication also.   Orders: -     Hemoglobin A1c  Morbid obesity (HCC) Assessment & Plan: I have had an extensive 30 minute  conversation today with the patient about healthy eating habits, exercise, calorie and carb goals for sustainable and successful weight loss. I gave the patient caloric and protein daily intake values as well as described the importance of increasing fiber and water intake. I discussed weight loss medications that could be used in the treatment of this patient. Handouts on low carb eating were given to the patient.    The diet plan I gave her is listed below:  Total calories- 2200 per day  Total protein:  at least 120 grams per day -- try for 130 grams per day  Total carbs: try for  90 -100 grams per day  Fiber: 40 grams per day.  Will start low dose of phentermine 8 mg tablets daily to help reduce appetite. Will see her back in 3 months for weight check.  Orders: -     Phentermine HCl; Take 1 tablet (8 mg total) by mouth daily.  Dispense: 30 tablet; Refill: 0 -     Phentermine HCl; Take 1 tablet (8 mg total) by mouth daily.  Dispense: 30 tablet; Refill: 0 -     Phentermine HCl; Take 1 tablet (8 mg total) by mouth daily.  Dispense: 30 tablet; Refill: 0    Return in about 3 months (around 04/27/2023) for weight loss.   Karie Georges, MD

## 2023-01-25 NOTE — Assessment & Plan Note (Signed)
With PCOS, pt is currently on metformin 500 mg BID. Will recheck her A1C today to monitor this. She may be a candidate for GLP therapy if it is elevated. Pt is already on a cholesterol medication also.

## 2023-01-25 NOTE — Assessment & Plan Note (Signed)
Current hypertension medications:       Sig   amLODipine (NORVASC) 5 MG tablet (Taking) Take 5 mg by mouth daily.   lisinopril-hydrochlorothiazide (PRINZIDE,ZESTORETIC) 20-25 MG tablet (Taking) Take 1 tablet by mouth daily.      Chronic, well controlled on the above medications, will continue these as prescribed.

## 2023-01-25 NOTE — Assessment & Plan Note (Signed)
I have had an extensive 30 minute conversation today with the patient about healthy eating habits, exercise, calorie and carb goals for sustainable and successful weight loss. I gave the patient caloric and protein daily intake values as well as described the importance of increasing fiber and water intake. I discussed weight loss medications that could be used in the treatment of this patient. Handouts on low carb eating were given to the patient.    The diet plan I gave her is listed below:  Total calories- 2200 per day  Total protein:  at least 120 grams per day -- try for 130 grams per day  Total carbs: try for  90 -100 grams per day  Fiber: 40 grams per day.  Will start low dose of phentermine 8 mg tablets daily to help reduce appetite. Will see her back in 3 months for weight check.

## 2023-01-25 NOTE — Patient Instructions (Addendum)
Total calories- 2200 per day  Total protein:  at least 120 grams per day -- try for 130 grams per day  Total carbs: try for  90 -100 grams per day  Fiber: 40 grams per day.  Ask your insurance if they cover saxenda, wegovy, or zepbound for weight loss medication.

## 2023-01-26 ENCOUNTER — Other Ambulatory Visit: Payer: Self-pay | Admitting: Family Medicine

## 2023-01-26 NOTE — Telephone Encounter (Signed)
It looks like her med list says lisinopril/HCT, can we clarify which medication she is taking? Thanks!

## 2023-01-26 NOTE — Telephone Encounter (Signed)
Patient informed of the message below and stated she is taking Lisinopril 20mg  daily-no combo.  She also wanted to let Dr Casimiro Needle know she received HgA1C results and is already taking Metformin 500g-BID.  Message sent to PCP.

## 2023-01-29 ENCOUNTER — Emergency Department (HOSPITAL_COMMUNITY)
Admission: EM | Admit: 2023-01-29 | Discharge: 2023-01-29 | Disposition: A | Discharge status: Home / Self Care | Payer: BC Managed Care – PPO | Attending: Emergency Medicine | Admitting: Emergency Medicine

## 2023-01-29 ENCOUNTER — Encounter (HOSPITAL_COMMUNITY): Payer: Self-pay

## 2023-01-29 ENCOUNTER — Other Ambulatory Visit: Payer: Self-pay

## 2023-01-29 DIAGNOSIS — B9689 Other specified bacterial agents as the cause of diseases classified elsewhere: Secondary | ICD-10-CM | POA: Insufficient documentation

## 2023-01-29 DIAGNOSIS — Z79899 Other long term (current) drug therapy: Secondary | ICD-10-CM | POA: Insufficient documentation

## 2023-01-29 DIAGNOSIS — N760 Acute vaginitis: Secondary | ICD-10-CM | POA: Diagnosis not present

## 2023-01-29 DIAGNOSIS — I10 Essential (primary) hypertension: Secondary | ICD-10-CM | POA: Diagnosis not present

## 2023-01-29 DIAGNOSIS — L292 Pruritus vulvae: Secondary | ICD-10-CM | POA: Diagnosis not present

## 2023-01-29 LAB — URINALYSIS, ROUTINE W REFLEX MICROSCOPIC
Bilirubin Urine: NEGATIVE
Glucose, UA: NEGATIVE mg/dL
Hgb urine dipstick: NEGATIVE
Ketones, ur: NEGATIVE mg/dL
Leukocytes,Ua: NEGATIVE
Nitrite: NEGATIVE
Protein, ur: NEGATIVE mg/dL
Specific Gravity, Urine: 1.027 (ref 1.005–1.030)
pH: 5 (ref 5.0–8.0)

## 2023-01-29 LAB — HCG, SERUM, QUALITATIVE: Preg, Serum: NEGATIVE

## 2023-01-29 LAB — WET PREP, GENITAL
Sperm: NONE SEEN
Trich, Wet Prep: NONE SEEN
WBC, Wet Prep HPF POC: <10 (ref <10)
Yeast Wet Prep HPF POC: NONE SEEN

## 2023-01-29 MED ORDER — CLINDAMYCIN HCL 300 MG PO CAPS: 300.0000 mg | ORAL_CAPSULE | Freq: Two times a day (BID) | ORAL | 0 refills | Status: AC

## 2023-01-29 MED ORDER — ACETAMINOPHEN 500 MG PO TABS
1000.0000 mg | ORAL_TABLET | Freq: Once | ORAL | Status: AC
  Administered 2023-01-29: 1000 mg via ORAL
  Filled 2023-01-29: qty 2

## 2023-01-29 MED ORDER — CLINDAMYCIN HCL 300 MG PO CAPS
300.0000 mg | ORAL_CAPSULE | Freq: Once | ORAL | Status: AC
  Administered 2023-01-29: 300 mg via ORAL
  Filled 2023-01-29: qty 1

## 2023-01-29 NOTE — ED Provider Notes (Signed)
Start EMERGENCY DEPARTMENT AT [facility] Provider Note   CSN: [id number] Arrival date & time: 01/29/23  2021     History  Chief Complaint  Patient presents with   Vaginal Itching    Margaret Hahn is a 37 y.o. female with past medical history significant for hypertension, anxiety, hyperlipidemia, uterine fibroids, PCOS presents to the ED with partner complaining of vaginal discomfort and itching.  Patient states that she believes she has BV and has a history of recurrent BV.  Patient had unprotected sex and symptoms developed shortly after.  She denies any unusual odor or bleeding.  She does have mild suprapubic pain and burning with urination.  Denies fever, pelvic pain, flank pain.  She reports that she does not urinate and wash with unscented soap after intercourse.       Home Medications Prior to Admission medications   Medication Sig Start Date End Date Taking? Authorizing Provider  clindamycin (CLEOCIN) 300 MG capsule Take 1 capsule (300 mg total) by mouth 2 (two) times daily for 7 days. 01/29/23 02/05/23 Yes Zabria Liss R, PA-C  amLODipine (NORVASC) 5 MG tablet Take 5 mg by mouth daily.    [provider]  cetirizine (ZYRTEC) 10 MG tablet Take 10 mg by mouth daily.    [provider]  ibuprofen (ADVIL) 800 MG tablet Take 1 tablet (800 mg total) by mouth every 8 (eight) hours as needed for moderate pain. 11/06/21   Bovard-Stuckert, Augusto Gamble, MD  lisinopril (ZESTRIL) 20 MG tablet TAKE 1 TABLET BY MOUTH EVERY DAY 01/27/23   Karie Georges, MD  metFORMIN (GLUCOPHAGE) 500 MG tablet Take by mouth daily with breakfast.    [provider]  Phentermine HCl 8 MG TABS Take 1 tablet (8 mg total) by mouth daily. 03/21/23   Karie Georges, MD  Phentermine HCl 8 MG TABS Take 1 tablet (8 mg total) by mouth daily. 02/23/23   Karie Georges, MD  Phentermine HCl 8 MG TABS Take 1 tablet (8 mg total) by mouth daily. 01/25/23   Karie Georges, MD   rosuvastatin (CRESTOR) 5 MG tablet Take 2.5 mg by mouth daily.    [provider]      Allergies    Flagyl [metronidazole]    Review of Systems   Review of Systems  Constitutional:  Negative for fever.  Genitourinary:  Positive for dysuria. Negative for flank pain, hematuria, pelvic pain, vaginal bleeding, vaginal discharge and vaginal pain.       Vaginal itching    Physical Exam Updated Vital Signs BP (!) 166/99 (BP Location: Left Arm)   Pulse 92   Temp 98.6 F (37 C) (Oral)   Resp 18   Ht 5\' 4"  (1.626 m)   Wt (!) 140.2 kg   LMP 01/04/2023 (Exact Date)   SpO2 100%   BMI 53.04 kg/m  Physical Exam Vitals and nursing note reviewed.  Constitutional:      General: She is not in acute distress.    Appearance: Normal appearance. She is not ill-appearing or diaphoretic.  Cardiovascular:     Rate and Rhythm: Normal rate and regular rhythm.  Pulmonary:     Effort: Pulmonary effort is normal.  Abdominal:     General: Abdomen is flat.     Palpations: Abdomen is soft.     Tenderness: There is abdominal tenderness in the suprapubic area. There is no guarding or rebound.  Genitourinary:    Comments: GU exam deferred.  Skin:  General: Skin is warm and dry.     Capillary Refill: Capillary refill takes less than 2 seconds.  Neurological:     Mental Status: She is alert. Mental status is at baseline.  Psychiatric:        Mood and Affect: Mood normal.        Behavior: Behavior normal.     ED Results / Procedures / Treatments   Labs (all labs ordered are listed, but only abnormal results are displayed) Labs Reviewed  WET PREP, GENITAL - Abnormal; Notable for the following components:      Result Value   Clue Cells Wet Prep HPF POC PRESENT (*)    All other components within normal limits  URINALYSIS, ROUTINE W REFLEX MICROSCOPIC - Abnormal; Notable for the following components:   APPearance HAZY (*)    All other components within normal limits  HCG, SERUM,  QUALITATIVE  HIV ANTIBODY (ROUTINE TESTING W REFLEX)  GC/CHLAMYDIA PROBE AMP ([place]) NOT AT [facility]    EKG None  Radiology No results found.  Procedures Procedures    Medications Ordered in ED Medications  clindamycin (CLEOCIN) capsule 300 mg (has no administration in time range)  acetaminophen (TYLENOL) tablet 1,000 mg (1,000 mg Oral Given 01/29/23 2131)    ED Course/ Medical Decision Making/ A&P                             Medical Decision Making Risk Prescription drug management.   This patient presents to the ED with chief complaint(s) of vaginal itching, dysuria with pertinent past medical history of recurrent BV, obesity.  The complaint involves an extensive differential diagnosis and also carries with it a high risk of complications and morbidity.    The differential diagnosis includes BV, trichomoniasis, yeast infection, gonorrhea, chlamydia  The initial plan is to obtain STD/STI workup  Additional history obtained: Records reviewed  -patient was seen on 11/15/2022 for vaginal irritation and diagnosed with bacterial vaginosis.  Patient is allergic to metronidazole and is typically treated with clindamycin.  Initial Assessment:   Exam significant for overall well-appearing patient who is not in acute distress.  Abdomen is soft and mildly tender in suprapubic region.  No overlying skin changes or appreciable hernias.  GU exam was deferred by patient.   Independent ECG/labs interpretation:  The following labs were independently interpreted:  Blood prep positive for BV, no yeast or trichomoniasis.  UA without infection.  Pregnancy negative.  GC chlamydia were collected and are pending.  HIV was also collected and pending.  Treatment and Reassessment: Patient given her first dose of clindamycin while in the ED.  Disposition:   Patient will be sent home on prescription for clindamycin to treat BV.  Patient will be notified of pending test results.  Recommended  OB/GYN follow-up should she continue to have symptoms.  Discussed trialing antibacterial soap after intercourse to see if this helps with recurrent BV.  The patient has been appropriately medically screened and/or stabilized in the ED. I have low suspicion for any other emergent medical condition which would require further screening, evaluation or treatment in the ED or require inpatient management. At time of discharge the patient is hemodynamically stable and in no acute distress. I have discussed work-up results and diagnosis with patient and answered all questions. Patient is agreeable with discharge plan. We discussed strict return precautions for returning to the emergency department and they verbalized understanding.  Final Clinical Impression(s) / ED Diagnoses Final diagnoses:  BV (bacterial vaginosis)    Rx / DC Orders ED Discharge Orders          Ordered    clindamycin (CLEOCIN) 300 MG capsule  2 times daily        01/29/23 2254              Lenard Simmer, PA-C 01/29/23 2255    Rozelle Logan, DO 01/29/23 2342

## 2023-01-29 NOTE — ED Triage Notes (Signed)
Pt. Arrives POV with partner c/o vaginal discomfort. Patient states that she has a history of recurrent BV. She states that she also recently had un protective sex as well. She denies any unusual odor, and bleeding. She also endorses lower abdominal pain.

## 2023-01-29 NOTE — Discharge Instructions (Signed)
Thank you for allowing [place] to be a part of your care today.  You were evaluated in the ED for vaginal discomfort  Your testing was positive for bacterial vaginosis.  I have sent over prescription for clindamycin to the pharmacy to treat this.  I do recommend following up with your OB/GYN if your symptoms do not improve despite treatment.  I recommend using antibacterial soap such as Dial after intercourse to see if this helps with the recurrent BV.  Your urine was negative for infection.  You will be notified of pending test results.  Return to the ED if you develop sudden worsening of your symptoms or if you have any new concerns.

## 2023-01-30 LAB — HIV ANTIBODY (ROUTINE TESTING W REFLEX): HIV Screen 4th Generation wRfx: NONREACTIVE

## 2023-01-31 LAB — GC/CHLAMYDIA PROBE AMP (~~LOC~~) NOT AT ARMC
Chlamydia: NEGATIVE
Comment: NEGATIVE
Comment: NORMAL
Neisseria Gonorrhea: NEGATIVE

## 2023-02-10 DIAGNOSIS — R102 Pelvic and perineal pain: Secondary | ICD-10-CM | POA: Diagnosis not present

## 2023-02-10 DIAGNOSIS — N898 Other specified noninflammatory disorders of vagina: Secondary | ICD-10-CM | POA: Diagnosis not present

## 2023-02-10 DIAGNOSIS — Z3202 Encounter for pregnancy test, result negative: Secondary | ICD-10-CM | POA: Diagnosis not present

## 2023-02-24 DIAGNOSIS — R102 Pelvic and perineal pain: Secondary | ICD-10-CM | POA: Diagnosis not present

## 2023-04-27 ENCOUNTER — Ambulatory Visit: Payer: BC Managed Care – PPO | Admitting: Family Medicine

## 2023-04-27 ENCOUNTER — Encounter: Payer: Self-pay | Admitting: Family Medicine

## 2023-04-27 VITALS — BP 136/86 | HR 85 | Temp 98.8°F | Ht 64.0 in | Wt 313.4 lb

## 2023-04-27 DIAGNOSIS — R7303 Prediabetes: Secondary | ICD-10-CM | POA: Diagnosis not present

## 2023-04-27 DIAGNOSIS — I10 Essential (primary) hypertension: Secondary | ICD-10-CM | POA: Diagnosis not present

## 2023-04-27 DIAGNOSIS — Z6841 Body Mass Index (BMI) 40.0 and over, adult: Secondary | ICD-10-CM

## 2023-04-27 LAB — LIPID PANEL
Cholesterol: 148 mg/dL (ref 0–200)
HDL: 62.8 mg/dL (ref >39.00)
LDL Cholesterol: 61 mg/dL (ref 0–99)
NonHDL: 84.93
Total CHOL/HDL Ratio: 2
Triglycerides: 118 mg/dL (ref 0.0–149.0)
VLDL: 23.6 mg/dL (ref 0.0–40.0)

## 2023-04-27 LAB — COMPREHENSIVE METABOLIC PANEL
ALT: 23 U/L (ref 0–35)
AST: 25 U/L (ref 0–37)
Albumin: 4.5 g/dL (ref 3.5–5.2)
Alkaline Phosphatase: 43 U/L (ref 39–117)
BUN: 8 mg/dL (ref 6–23)
CO2: 28 meq/L (ref 19–32)
Calcium: 9.6 mg/dL (ref 8.4–10.5)
Chloride: 101 meq/L (ref 96–112)
Creatinine, Ser: 0.73 mg/dL (ref 0.40–1.20)
GFR: 104.33 mL/min (ref >60.00)
Glucose, Bld: 88 mg/dL (ref 70–99)
Potassium: 3.5 meq/L (ref 3.5–5.1)
Sodium: 138 meq/L (ref 135–145)
Total Bilirubin: 0.6 mg/dL (ref 0.2–1.2)
Total Protein: 7.9 g/dL (ref 6.0–8.3)

## 2023-04-27 LAB — HEMOGLOBIN A1C: Hgb A1c MFr Bld: 6.3 % (ref 4.6–6.5)

## 2023-04-27 MED ORDER — ROSUVASTATIN CALCIUM 5 MG PO TABS
2.5000 mg | ORAL_TABLET | Freq: Every day | ORAL | 1 refills | Status: DC
Start: 2023-04-27 — End: 2023-09-19

## 2023-04-27 MED ORDER — BUPROPION HCL ER (SR) 100 MG PO TB12: 100.0000 mg | ORAL_TABLET | Freq: Every morning | ORAL | 2 refills | Status: DC

## 2023-04-27 MED ORDER — LISINOPRIL 20 MG PO TABS
20.0000 mg | ORAL_TABLET | Freq: Every day | ORAL | 1 refills | Status: DC
Start: 2023-04-27 — End: 2023-10-18

## 2023-04-27 MED ORDER — AMLODIPINE BESYLATE 5 MG PO TABS
5.0000 mg | ORAL_TABLET | Freq: Every day | ORAL | 1 refills | Status: DC
Start: 2023-04-27 — End: 2023-09-19

## 2023-04-27 NOTE — Assessment & Plan Note (Signed)
Pt had adverse reaction to phentermine, had 20 minute discussion with patient about the use of medications to aid weight loss, will continue her current diet plan as previously outlined in the last progress note and will start bupropion 100 mg daily. RTC in 3 months for weight check.

## 2023-04-27 NOTE — Assessment & Plan Note (Signed)
With PCOS, pt is currently on metformin 500 mg BID. Will recheck her A1C today to monitor this. She may be a candidate for GLP therapy if it is elevated. Pt is already on a cholesterol medication also. Lipid panel has also been ordered for surveillance.

## 2023-04-27 NOTE — Progress Notes (Signed)
Established Patient Office Visit  Subjective   Patient ID: Margaret Hahn, female    DOB: 10-04-1985  Age: 37 y.o. MRN: [id number]  Chief Complaint  Patient presents with   Medical Management of Chronic Issues    Pt is here for 3 month follow up. She reports she could not tolerate the medication due to having heart palpitations. States that it made her feel "too weird" so she stopped the medication. We discussed other options including GLP medication, wellbutrin and surgery as a last resort. Pt states she is nervous about the GLP's, I discussed the risks/benefits of each method, she wants to try the wellbutrin first.   HTN -- BP in office performed and is well controlled. She  reports no side effects to the medications, no chest pain, SOB, dizziness or headaches. She has a BP cuff at home and is checking BP regularly, reports they are in the normal range.          Current Outpatient Medications  Medication Instructions   amLODipine (NORVASC) 5 mg, Oral, Daily   buPROPion ER (WELLBUTRIN SR) 100 mg, Oral, Every morning   cetirizine (ZYRTEC) 10 mg, Oral, Daily   ibuprofen (ADVIL) 800 mg, Oral, Every 8 hours PRN   lisinopril (ZESTRIL) 20 mg, Oral, Daily   metFORMIN (GLUCOPHAGE) 500 MG tablet Oral, Daily with breakfast   rosuvastatin (CRESTOR) 2.5 mg, Oral, Daily    Patient Active Problem List   Diagnosis Date Noted   Morbid obesity (HCC) 01/25/2023   Prediabetes 01/25/2023   Uterine polyp 11/06/2021   Abnormal uterine bleeding (AUB) 11/04/2021   Leiomyoma of uterus 11/04/2021   Pelvic pain 11/04/2021   PCOS (polycystic ovarian syndrome) 04/25/2013   Metrorrhagia 04/25/2013   HTN (hypertension) 04/25/2013      Review of Systems  All other systems reviewed and are negative.     Objective:     BP 136/86 (BP Location: Left Arm, Patient Position: Sitting, Cuff Size: Large)   Pulse 85   Temp 98.8 F (37.1 C) (Oral)   Ht 5\' 4"  (1.626 m)   Wt (!) 313 lb 6.4 oz (142.2  kg)   LMP 04/12/2023 (Exact Date)   SpO2 99%   BMI 53.79 kg/m    Physical Exam Vitals reviewed.  Constitutional:      Appearance: Normal appearance. She is morbidly obese.  Cardiovascular:     Rate and Rhythm: Normal rate and regular rhythm.     Heart sounds: Normal heart sounds. No murmur heard. Pulmonary:     Effort: Pulmonary effort is normal.     Breath sounds: Normal breath sounds.  Abdominal:     General: Bowel sounds are normal.  Musculoskeletal:     Right lower leg: No edema.     Left lower leg: No edema.  Neurological:     Mental Status: She is alert and oriented to person, place, and time. Mental status is at baseline.      No results found for any visits on 04/27/23.    The ASCVD Risk score (Arnett DK, et al., 2019) failed to calculate for the following reasons:   The 2019 ASCVD risk score is only valid for ages 50 to 44    Assessment & Plan:  Primary hypertension Assessment & Plan: Current hypertension medications:       Sig   amLODipine (NORVASC) 5 MG tablet Take 1 tablet (5 mg total) by mouth daily.   lisinopril (ZESTRIL) 20 MG tablet Take 1 tablet (20 mg  total) by mouth daily.      Chronic, well controlled on the above medications, will continue these as prescribed. She is due for CMP today  Orders: -     amLODIPine Besylate; Take 1 tablet (5 mg total) by mouth daily.  Dispense: 90 tablet; Refill: 1 -     Lisinopril; Take 1 tablet (20 mg total) by mouth daily.  Dispense: 90 tablet; Refill: 1  Prediabetes Assessment & Plan: With PCOS, pt is currently on metformin 500 mg BID. Will recheck her A1C today to monitor this. She may be a candidate for GLP therapy if it is elevated. Pt is already on a cholesterol medication also. Lipid panel has also been ordered for surveillance.   Orders: -     Rosuvastatin Calcium; Take 0.5 tablets (2.5 mg total) by mouth daily.  Dispense: 45 tablet; Refill: 1 -     Hemoglobin A1c  Morbid obesity (HCC) Assessment  & Plan: Pt had adverse reaction to phentermine, had 20 minute discussion with patient about the use of medications to aid weight loss, will continue her current diet plan as previously outlined in the last progress note and will start bupropion 100 mg daily. RTC in 3 months for weight check.   Orders: -     Comprehensive metabolic panel -     Lipid panel -     buPROPion HCl ER (SR); Take 1 tablet (100 mg total) by mouth in the morning.  Dispense: 30 tablet; Refill: 2     Return in about 3 months (around 07/28/2023) for weight loss.    Karie Georges, MD

## 2023-04-27 NOTE — Assessment & Plan Note (Addendum)
Current hypertension medications:       Sig   amLODipine (NORVASC) 5 MG tablet Take 1 tablet (5 mg total) by mouth daily.   lisinopril (ZESTRIL) 20 MG tablet Take 1 tablet (20 mg total) by mouth daily.      Chronic, well controlled on the above medications, will continue these as prescribed. She is due for CMP today

## 2023-05-05 DIAGNOSIS — I10 Essential (primary) hypertension: Secondary | ICD-10-CM | POA: Diagnosis not present

## 2023-06-07 DIAGNOSIS — N760 Acute vaginitis: Secondary | ICD-10-CM | POA: Diagnosis not present

## 2023-06-07 DIAGNOSIS — N898 Other specified noninflammatory disorders of vagina: Secondary | ICD-10-CM | POA: Diagnosis not present

## 2023-06-29 DIAGNOSIS — R3129 Other microscopic hematuria: Secondary | ICD-10-CM | POA: Diagnosis not present

## 2023-06-29 DIAGNOSIS — E119 Type 2 diabetes mellitus without complications: Secondary | ICD-10-CM | POA: Diagnosis not present

## 2023-06-29 DIAGNOSIS — N926 Irregular menstruation, unspecified: Secondary | ICD-10-CM | POA: Diagnosis not present

## 2023-06-29 DIAGNOSIS — R7303 Prediabetes: Secondary | ICD-10-CM | POA: Diagnosis not present

## 2023-06-29 DIAGNOSIS — E88819 Insulin resistance, unspecified: Secondary | ICD-10-CM | POA: Diagnosis not present

## 2023-06-29 DIAGNOSIS — Z01411 Encounter for gynecological examination (general) (routine) with abnormal findings: Secondary | ICD-10-CM | POA: Diagnosis not present

## 2023-06-29 LAB — HEMOGLOBIN A1C: Hemoglobin A1C: 6.2

## 2023-06-30 ENCOUNTER — Encounter: Payer: Self-pay | Admitting: Obstetrics and Gynecology

## 2023-06-30 LAB — LAB REPORT - SCANNED: A1c: 6.2

## 2023-07-11 ENCOUNTER — Other Ambulatory Visit: Payer: Self-pay

## 2023-07-11 ENCOUNTER — Encounter (HOSPITAL_COMMUNITY): Payer: Self-pay

## 2023-07-11 ENCOUNTER — Encounter: Payer: Self-pay | Admitting: Family Medicine

## 2023-07-11 ENCOUNTER — Emergency Department (HOSPITAL_COMMUNITY)
Admission: EM | Admit: 2023-07-11 | Discharge: 2023-07-11 | Disposition: A | Discharge status: Home / Self Care | Payer: BC Managed Care – PPO | Attending: Emergency Medicine | Admitting: Emergency Medicine

## 2023-07-11 DIAGNOSIS — M25552 Pain in left hip: Secondary | ICD-10-CM | POA: Diagnosis not present

## 2023-07-11 DIAGNOSIS — M25512 Pain in left shoulder: Secondary | ICD-10-CM | POA: Insufficient documentation

## 2023-07-11 DIAGNOSIS — I10 Essential (primary) hypertension: Secondary | ICD-10-CM | POA: Insufficient documentation

## 2023-07-11 DIAGNOSIS — Z79899 Other long term (current) drug therapy: Secondary | ICD-10-CM | POA: Insufficient documentation

## 2023-07-11 DIAGNOSIS — Y92410 Unspecified street and highway as the place of occurrence of the external cause: Secondary | ICD-10-CM | POA: Insufficient documentation

## 2023-07-11 DIAGNOSIS — M79605 Pain in left leg: Secondary | ICD-10-CM | POA: Diagnosis not present

## 2023-07-11 DIAGNOSIS — M542 Cervicalgia: Secondary | ICD-10-CM | POA: Diagnosis not present

## 2023-07-11 DIAGNOSIS — M545 Low back pain, unspecified: Secondary | ICD-10-CM | POA: Diagnosis not present

## 2023-07-11 MED ORDER — IBUPROFEN 800 MG PO TABS
800.0000 mg | ORAL_TABLET | Freq: Once | ORAL | Status: AC
  Administered 2023-07-11: 800 mg via ORAL
  Filled 2023-07-11: qty 1

## 2023-07-11 MED ORDER — IBUPROFEN 600 MG PO TABS: 600.0000 mg | ORAL_TABLET | Freq: Four times a day (QID) | ORAL | 0 refills | Status: AC | PRN

## 2023-07-11 MED ORDER — CYCLOBENZAPRINE HCL 10 MG PO TABS: 10.0000 mg | ORAL_TABLET | Freq: Two times a day (BID) | ORAL | 0 refills | Status: DC | PRN
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
## 2023-07-27 ENCOUNTER — Ambulatory Visit: Payer: BC Managed Care – PPO | Admitting: Family Medicine

## 2023-08-04 ENCOUNTER — Encounter: Payer: Self-pay | Admitting: Family Medicine

## 2023-08-04 ENCOUNTER — Ambulatory Visit (INDEPENDENT_AMBULATORY_CARE_PROVIDER_SITE_OTHER): Payer: BC Managed Care – PPO | Admitting: Family Medicine

## 2023-08-04 VITALS — BP 138/88 | HR 97 | Temp 98.3°F | Ht 64.0 in | Wt 319.0 lb

## 2023-08-04 DIAGNOSIS — R7303 Prediabetes: Secondary | ICD-10-CM | POA: Diagnosis not present

## 2023-08-04 DIAGNOSIS — I10 Essential (primary) hypertension: Secondary | ICD-10-CM | POA: Diagnosis not present

## 2023-08-04 DIAGNOSIS — E282 Polycystic ovarian syndrome: Secondary | ICD-10-CM

## 2023-08-04 MED ORDER — ZEPBOUND 5 MG/0.5ML ~~LOC~~ SOAJ
5.0000 mg | SUBCUTANEOUS | 0 refills | Status: DC
Start: 2023-08-04 — End: 2023-08-08

## 2023-08-04 MED ORDER — ZEPBOUND 7.5 MG/0.5ML ~~LOC~~ SOAJ
7.5000 mg | SUBCUTANEOUS | 0 refills | Status: DC
Start: 2023-08-04 — End: 2023-08-08

## 2023-08-04 MED ORDER — TIRZEPATIDE-WEIGHT MANAGEMENT 2.5 MG/0.5ML ~~LOC~~ SOAJ: 2.5000 mg | SUBCUTANEOUS | 11 refills | Status: DC

## 2023-08-04 NOTE — Assessment & Plan Note (Signed)
Chronic, now it is under 140 which is her goal. Continue amlodipine 5 mg daily. I think that the weight loss will dramatically improve her blood pressure, which is a comorbid condition.

## 2023-08-04 NOTE — Assessment & Plan Note (Addendum)
I have had an extensive 30 minute conversation today with the patient about healthy eating habits, exercise, calorie and carb goals for sustainable and successful weight loss. I gave the patient caloric and protein daily intake values as well as described the importance of increasing fiber and water intake. I discussed weight loss medications that could be used in the treatment of this patient. Handouts on low carb eating were given to the patient.    Total calories per day:1900   Total carbs: around 110 grams per day  Total protein: over 110 grams per day  Carb Manager, Loseit! Are apps she may use to help track her goals. She will continue to see me regularly, pt has voiced understanding of the calorie and carb goals and is committed to continuing the dietary plan. Pt has tried and failed phentermine, wellbutrin, metformin, and other diet and exercise plans.

## 2023-08-04 NOTE — Patient Instructions (Addendum)
Total calories per day:1900   Total carbs: around 110 grams per day  Total protein: over 110 grams per day  Carb Manager, Loseit!

## 2023-08-04 NOTE — Progress Notes (Signed)
Established Patient Office Visit  Subjective   Patient ID: Margaret Hahn, female    DOB: 1985-09-18  Age: 38 y.o. MRN: [id number]  Chief Complaint  Patient presents with   Medical Management of Chronic Issues    Pt is here for weight loss consultation and follow up today. She reports that the metformin and wellbutrin are not helping much, can't really tell she is taking the medication. Has gained 4 pounds since her last visit. Pt states that she would like to consider GLP medication to help her lose weight. She reports several year history of struggling with her weight, has been on diets in the past, different exercise programs, etc  which were not successful. Had adverse reaction to phentermine in the past.     Current Outpatient Medications  Medication Instructions   amLODipine (NORVASC) 5 mg, Oral, Daily   buPROPion ER (WELLBUTRIN SR) 100 mg, Oral, Every morning   cetirizine (ZYRTEC) 10 mg, Daily   cyclobenzaprine (FLEXERIL) 10 mg, Oral, 2 times daily PRN   ibuprofen (ADVIL) 600 mg, Oral, Every 6 hours PRN   lisinopril (ZESTRIL) 20 mg, Oral, Daily   metFORMIN (GLUCOPHAGE) 500 MG tablet Daily with breakfast   rosuvastatin (CRESTOR) 2.5 mg, Oral, Daily   tirzepatide (ZEPBOUND) 2.5 mg, Subcutaneous, Weekly   Zepbound 5 mg, Subcutaneous, Weekly   Zepbound 7.5 mg, Subcutaneous, Weekly      Review of Systems  All other systems reviewed and are negative.     Objective:     BP 138/88   Pulse 97   Temp 98.3 F (36.8 C) (Oral)   Ht 5\' 4"  (1.626 m)   Wt (!) 319 lb (144.7 kg)   LMP 07/18/2023 (Approximate)   SpO2 98%   BMI 54.76 kg/m    Physical Exam Vitals reviewed.  Constitutional:      Appearance: Normal appearance. She is morbidly obese.  Eyes:     Conjunctiva/sclera: Conjunctivae normal.  Pulmonary:     Effort: Pulmonary effort is normal.  Neurological:     Mental Status: She is alert and oriented to person, place, and time. Mental status is at baseline.   Psychiatric:        Mood and Affect: Mood normal.        Behavior: Behavior normal.      Results for orders placed or performed in visit on 08/04/23  Hemoglobin A1c  Result Value Ref Range   Hemoglobin A1C 6.2       The ASCVD Risk score (Arnett DK, et al., 2019) failed to calculate for the following reasons:   The 2019 ASCVD risk score is only valid for ages 51 to 6    Assessment & Plan:  Prediabetes Assessment & Plan: A1C not much better with the metformin, see below.    Morbid obesity (HCC) Assessment & Plan: I have had an extensive 30 minute conversation today with the patient about healthy eating habits, exercise, calorie and carb goals for sustainable and successful weight loss. I gave the patient caloric and protein daily intake values as well as described the importance of increasing fiber and water intake. I discussed weight loss medications that could be used in the treatment of this patient. Handouts on low carb eating were given to the patient.    Total calories per day:1900   Total carbs: around 110 grams per day  Total protein: over 110 grams per day  Carb Manager, Loseit! Are apps she may use to help track her goals. She  will continue to see me regularly, pt has voiced understanding of the calorie and carb goals and is committed to continuing the dietary plan. Pt has tried and failed phentermine, wellbutrin, metformin, and other diet and exercise plans.   Orders: -     Tirzepatide-Weight Management; Inject 2.5 mg into the skin once a week.  Dispense: 2 mL; Refill: 11 -     Zepbound; Inject 5 mg into the skin once a week.  Dispense: 2 mL; Refill: 0 -     Zepbound; Inject 7.5 mg into the skin once a week.  Dispense: 2 mL; Refill: 0  PCOS (polycystic ovarian syndrome)  Primary hypertension Assessment & Plan: Chronic, now it is under 140 which is her goal. Continue amlodipine 5 mg daily. I think that the weight loss will dramatically improve her blood  pressure, which is a comorbid condition.       Return in about 3 months (around 11/02/2023) for weight loss.    Karie Georges, MD

## 2023-08-04 NOTE — Assessment & Plan Note (Signed)
A1C not much better with the metformin, see below.

## 2023-08-05 ENCOUNTER — Encounter: Payer: Self-pay | Admitting: *Deleted

## 2023-08-05 ENCOUNTER — Telehealth: Payer: Self-pay

## 2023-08-05 ENCOUNTER — Other Ambulatory Visit (HOSPITAL_COMMUNITY): Payer: Self-pay

## 2023-08-05 NOTE — Telephone Encounter (Signed)
Pharmacy Patient Advocate Encounter  Received notification from [facility] [employer] that Prior Authorization for Zepbound 2.5MG /0.5ML pen-injectors has been DENIED.  See denial reason below. No denial letter attached in CMM. Will attach denial letter to Media tab once received.   PA #/Case ID/Reference #: [id number]   DENIAL REASON: Plan exclusion

## 2023-08-05 NOTE — Telephone Encounter (Signed)
Copied from CRM [id number]. Topic: Clinical - Medication Question >> Aug 05, 2023  8:50 AM Almira Coaster wrote: Reason for CRM: Patient is calling to advise that she would like her tirzepatide (ZEPBOUND) 2.5 MG/0.5ML [facility] to [facility]'s [facility], she also stated that this medication required a pre-authorization.She would also like to know if there is a medication assistance program or coupons she can use.

## 2023-08-05 NOTE — Telephone Encounter (Signed)
Duplicate message-Mychart message previously sent to PA team for assistance.

## 2023-08-05 NOTE — Telephone Encounter (Signed)
Please let pt know that her insurance isn't covering the medication

## 2023-08-05 NOTE — Telephone Encounter (Signed)
Pharmacy Patient Advocate Encounter   Received notification from Patient Advice Request messages that prior authorization for Zepbound 2.5MG /0.5ML pen-injectors is required/requested.   Insurance verification completed.   The patient is insured through [facility] .   Per test claim: PA required; PA started via CoverMyMeds. KEY [id number] . Waiting for clinical questions to populate.

## 2023-08-05 NOTE — Telephone Encounter (Signed)
Clinical questions have been answered and PA submitted. PA currently Pending.

## 2023-08-22 DIAGNOSIS — R3121 Asymptomatic microscopic hematuria: Secondary | ICD-10-CM | POA: Diagnosis not present

## 2023-09-19 ENCOUNTER — Other Ambulatory Visit: Payer: Self-pay | Admitting: Family Medicine

## 2023-09-19 DIAGNOSIS — R7303 Prediabetes: Secondary | ICD-10-CM

## 2023-09-19 DIAGNOSIS — I10 Essential (primary) hypertension: Secondary | ICD-10-CM

## 2023-10-17 ENCOUNTER — Other Ambulatory Visit: Payer: Self-pay | Admitting: Family Medicine

## 2023-10-17 DIAGNOSIS — I10 Essential (primary) hypertension: Secondary | ICD-10-CM

## 2023-10-29 ENCOUNTER — Other Ambulatory Visit: Payer: Self-pay

## 2023-10-29 ENCOUNTER — Emergency Department (HOSPITAL_COMMUNITY)

## 2023-10-29 ENCOUNTER — Emergency Department (HOSPITAL_COMMUNITY)
Admission: EM | Admit: 2023-10-29 | Discharge: 2023-10-29 | Disposition: A | Discharge status: Home / Self Care | Attending: Emergency Medicine | Admitting: Emergency Medicine

## 2023-10-29 DIAGNOSIS — R109 Unspecified abdominal pain: Secondary | ICD-10-CM | POA: Diagnosis not present

## 2023-10-29 DIAGNOSIS — R1084 Generalized abdominal pain: Secondary | ICD-10-CM | POA: Insufficient documentation

## 2023-10-29 DIAGNOSIS — R197 Diarrhea, unspecified: Secondary | ICD-10-CM | POA: Insufficient documentation

## 2023-10-29 DIAGNOSIS — R350 Frequency of micturition: Secondary | ICD-10-CM | POA: Diagnosis not present

## 2023-10-29 DIAGNOSIS — K760 Fatty (change of) liver, not elsewhere classified: Secondary | ICD-10-CM | POA: Diagnosis not present

## 2023-10-29 DIAGNOSIS — Z79899 Other long term (current) drug therapy: Secondary | ICD-10-CM | POA: Diagnosis not present

## 2023-10-29 DIAGNOSIS — I10 Essential (primary) hypertension: Secondary | ICD-10-CM | POA: Insufficient documentation

## 2023-10-29 DIAGNOSIS — N12 Tubulo-interstitial nephritis, not specified as acute or chronic: Secondary | ICD-10-CM | POA: Diagnosis not present

## 2023-10-29 LAB — CBC
HCT: 40.4 % (ref 36.0–46.0)
Hemoglobin: 12.2 g/dL (ref 12.0–15.0)
MCH: 28.2 pg (ref 26.0–34.0)
MCHC: 30.2 g/dL (ref 30.0–36.0)
MCV: 93.3 fL (ref 80.0–100.0)
Platelets: 340 10*3/uL (ref 150–400)
RBC: 4.33 MIL/uL (ref 3.87–5.11)
RDW: 14.8 % (ref 11.5–15.5)
WBC: 7.8 10*3/uL (ref 4.0–10.5)
nRBC: 0 % (ref 0.0–0.2)

## 2023-10-29 LAB — HCG, SERUM, QUALITATIVE: Preg, Serum: NEGATIVE

## 2023-10-29 LAB — URINALYSIS, ROUTINE W REFLEX MICROSCOPIC
Bilirubin Urine: NEGATIVE
Glucose, UA: NEGATIVE mg/dL
Hgb urine dipstick: NEGATIVE
Ketones, ur: NEGATIVE mg/dL
Leukocytes,Ua: NEGATIVE
Nitrite: NEGATIVE
Protein, ur: NEGATIVE mg/dL
Specific Gravity, Urine: 1.018 (ref 1.005–1.030)
pH: 6 (ref 5.0–8.0)

## 2023-10-29 LAB — COMPREHENSIVE METABOLIC PANEL WITH GFR
ALT: 30 U/L (ref 0–44)
AST: 25 U/L (ref 15–41)
Albumin: 4 g/dL (ref 3.5–5.0)
Alkaline Phosphatase: 42 U/L (ref 38–126)
Anion gap: 7 (ref 5–15)
BUN: 16 mg/dL (ref 6–20)
CO2: 28 mmol/L (ref 22–32)
Calcium: 9.8 mg/dL (ref 8.9–10.3)
Chloride: 105 mmol/L (ref 98–111)
Creatinine, Ser: 0.9 mg/dL (ref 0.44–1.00)
GFR, Estimated: >60 mL/min (ref >60)
Glucose, Bld: 102 mg/dL — ABNORMAL HIGH (ref 70–99)
Potassium: 3.7 mmol/L (ref 3.5–5.1)
Sodium: 140 mmol/L (ref 135–145)
Total Bilirubin: 0.6 mg/dL (ref 0.0–1.2)
Total Protein: 7.9 g/dL (ref 6.5–8.1)

## 2023-10-29 LAB — LIPASE, BLOOD: Lipase: 32 U/L (ref 11–51)

## 2023-10-29 MED ORDER — ONDANSETRON HCL 4 MG PO TABS: 4.0000 mg | ORAL_TABLET | Freq: Four times a day (QID) | ORAL | 0 refills | Status: DC

## 2023-10-29 MED ORDER — DICYCLOMINE HCL 20 MG PO TABS: 20.0000 mg | ORAL_TABLET | Freq: Two times a day (BID) | ORAL | 0 refills | Status: DC

## 2023-10-29 MED ORDER — FENTANYL CITRATE PF 50 MCG/ML IJ SOSY
50.0000 ug | PREFILLED_SYRINGE | Freq: Once | INTRAMUSCULAR | Status: AC
  Administered 2023-10-29: 50 ug via INTRAVENOUS
  Filled 2023-10-29: qty 1

## 2023-10-29 MED ORDER — IOHEXOL 300 MG/ML  SOLN
100.0000 mL | Freq: Once | INTRAMUSCULAR | Status: AC | PRN
  Administered 2023-10-29: 100 mL via INTRAVENOUS

## 2023-10-29 MED ORDER — SODIUM CHLORIDE (PF) 0.9 % IJ SOLN
INTRAMUSCULAR | Status: AC
  Filled 2023-10-29: qty 50

## 2023-10-29 MED ORDER — LACTATED RINGERS IV BOLUS
1000.0000 mL | Freq: Once | INTRAVENOUS | Status: AC
  Administered 2023-10-29: 1000 mL via INTRAVENOUS

## 2023-10-29 NOTE — ED Provider Notes (Signed)
 [place] EMERGENCY DEPARTMENT AT [facility] Provider Note   CSN: [id number] Arrival date & time: 10/29/23  2536     History  Chief Complaint  Patient presents with   Abdominal Pain    Margaret Hahn is a 38 y.o. female.   Abdominal Pain    38 year old female with medical history significant for morbid obesity, PCOS, HTN, leiomyoma, HLD, HTN, history of appendectomy and cholecystectomy who presents to the emergency department with right-sided abdominal pain.  The patient states that since yesterday morning she has had pain radiating to her right flank.  She endorses urinary frequency, denies any hematuria or dysuria.  She denies any fevers or chills.  No nausea or vomiting.  2 days ago she did have an episode of watery diarrhea.  She endorses 8 out of 10 abdominal pain primarily in the right side of her abdomen radiating to her right flank.  No known history of kidney stones.  Pain is constant, not intermittent.  She denies any vaginal bleeding or vaginal discharge.   Home Medications Prior to Admission medications   Medication Sig Start Date End Date Taking? Authorizing Provider  amLODipine  (NORVASC ) 5 MG tablet Take 1 tablet (5 mg total) by mouth daily. 09/19/23   Aida House, MD  buPROPion  ER (WELLBUTRIN  SR) 100 MG 12 hr tablet Take 1 tablet (100 mg total) by mouth in the morning. 04/27/23   Aida House, MD  cetirizine (ZYRTEC) 10 MG tablet Take 10 mg by mouth daily.    [provider]  cyclobenzaprine  (FLEXERIL ) 10 MG tablet Take 1 tablet (10 mg total) by mouth 2 (two) times daily as needed for muscle spasms. 07/11/23   Debbra Fairy, PA-C  ibuprofen  (ADVIL ) 600 MG tablet Take 1 tablet (600 mg total) by mouth every 6 (six) hours as needed. 07/11/23   Debbra Fairy, PA-C  lisinopril  (ZESTRIL ) 20 MG tablet Take 1 tablet (20 mg total) by mouth daily. 10/18/23   Aida House, MD  metFORMIN (GLUCOPHAGE) 500 MG tablet Take by mouth daily with  breakfast.    [provider]  rosuvastatin  (CRESTOR ) 5 MG tablet Take 1/2 tablet (2.5 mg total) by mouth daily. 09/19/23   Aida House, MD      Allergies    Flagyl  [metronidazole ]    Review of Systems   Review of Systems  Gastrointestinal:  Positive for abdominal pain.    Physical Exam Updated Vital Signs BP (!) 165/117 (BP Location: Left Arm)   Pulse 71   Temp 97.9 F (36.6 C) (Oral)   Resp 16   Ht 5\' 4"  (1.626 m)   Wt (!) 142.4 kg   SpO2 94%   BMI 53.90 kg/m  Physical Exam Vitals and nursing note reviewed.  Constitutional:      General: She is not in acute distress.    Appearance: She is well-developed.  HENT:     Head: Normocephalic and atraumatic.  Eyes:     Conjunctiva/sclera: Conjunctivae normal.  Cardiovascular:     Rate and Rhythm: Normal rate and regular rhythm.     Heart sounds: No murmur heard. Pulmonary:     Effort: Pulmonary effort is normal. No respiratory distress.     Breath sounds: Normal breath sounds.  Abdominal:     Palpations: Abdomen is soft.     Tenderness: There is generalized abdominal tenderness.  Musculoskeletal:        General: No swelling.     Cervical back: Neck supple.  Skin:    General: Skin is warm and dry.     Capillary Refill: Capillary refill takes less than 2 seconds.  Neurological:     Mental Status: She is alert.  Psychiatric:        Mood and Affect: Mood normal.     ED Results / Procedures / Treatments   Labs (all labs ordered are listed, but only abnormal results are displayed) Labs Reviewed  COMPREHENSIVE METABOLIC PANEL WITH GFR - Abnormal; Notable for the following components:      Result Value   Glucose, Bld 102 (*)    All other components within normal limits  LIPASE, BLOOD  CBC  URINALYSIS, ROUTINE W REFLEX MICROSCOPIC  HCG, SERUM, QUALITATIVE    EKG None  Radiology No results found.  Procedures Procedures    Medications Ordered in ED Medications  fentaNYL  (SUBLIMAZE )  injection 50 mcg (50 mcg Intravenous Given 10/29/23 0624)  lactated ringers  bolus 1,000 mL (1,000 mLs Intravenous New Bag/Given 10/29/23 8119)    ED Course/ Medical Decision Making/ A&P                                 Medical Decision Making Amount and/or Complexity of Data Reviewed Labs: ordered. Radiology: ordered.  Risk Prescription drug management.     38 year old female with medical history significant for morbid obesity, PCOS, HTN, leiomyoma, HLD, HTN, history of appendectomy and cholecystectomy who presents to the emergency department with right-sided abdominal pain.  The patient states that since yesterday morning she has had pain radiating to her right flank.  She endorses urinary frequency, denies any hematuria or dysuria.  She denies any fevers or chills.  No nausea or vomiting.  2 days ago she did have an episode of watery diarrhea.  She endorses 8 out of 10 abdominal pain primarily in the right side of her abdomen radiating to her right flank.  No known history of kidney stones.  Pain is constant, not intermittent.  She denies any vaginal bleeding or vaginal discharge.   On arrival, the patient was vitally stable, afebrile, not tachycardic or tachypneic, hypertensive BP 165/117, saturating 94% on room air. Differential diagnose includes UTI/pyelonephritis, colitis, nephrolithiasis.  IV access obtained the patient was administered IV fluid bolus, IV fentanyl  for pain control.  Laboratory evaluation revealed urinalysis negative for UTI or hematuria, CBC without a leukocytosis or anemia, lipase normal, CMP generally unremarkable, serum hCG negative.  CT imaging was ordered to further evaluate and was pending at time of signout.  Plan at time of signout follow-up results of CT imaging, reassess the patient, ultimate disposition pending results of reassessment and imaging evaluation.  Signout given to Dr. Reba Camper at 0700.  Final Clinical Impression(s) / ED Diagnoses Final diagnoses:   Generalized abdominal pain    Rx / DC Orders ED Discharge Orders     None         Rosealee Concha, MD 10/29/23 [phone]

## 2023-10-29 NOTE — ED Triage Notes (Signed)
 Pt reports right sided dull abdominal pain starting yesterday morning radiating to right flank. Increased urinary frequency. Denies N/V/D, fever. Pain is constant. No aggravating or alleviating factors. NAD noted in triage.

## 2023-10-29 NOTE — ED Provider Notes (Signed)
 Patient seen after prior ED provider.  Patient feels improved and desires DC.   Importance of close FU stressed. Strict return precautions given and understood.    Margaret Carte, MD 10/29/23 [phone]

## 2023-10-29 NOTE — Discharge Instructions (Addendum)
 Return for any problem.  ?

## 2023-11-07 ENCOUNTER — Ambulatory Visit: Payer: BC Managed Care – PPO | Admitting: Family Medicine

## 2023-11-11 ENCOUNTER — Encounter: Payer: Self-pay | Admitting: Family Medicine

## 2023-11-11 ENCOUNTER — Ambulatory Visit (INDEPENDENT_AMBULATORY_CARE_PROVIDER_SITE_OTHER): Admitting: Family Medicine

## 2023-11-11 ENCOUNTER — Other Ambulatory Visit (HOSPITAL_COMMUNITY)
Admission: RE | Admit: 2023-11-11 | Discharge: 2023-11-11 | Disposition: A | Discharge status: Home / Self Care | Source: Ambulatory Visit | Attending: Family Medicine | Admitting: Family Medicine

## 2023-11-11 VITALS — BP 138/88 | HR 80 | Temp 98.2°F | Ht 64.0 in | Wt 316.5 lb

## 2023-11-11 DIAGNOSIS — R7303 Prediabetes: Secondary | ICD-10-CM | POA: Diagnosis not present

## 2023-11-11 DIAGNOSIS — N898 Other specified noninflammatory disorders of vagina: Secondary | ICD-10-CM

## 2023-11-11 DIAGNOSIS — I10 Essential (primary) hypertension: Secondary | ICD-10-CM

## 2023-11-11 LAB — POCT GLYCOSYLATED HEMOGLOBIN (HGB A1C): Hemoglobin A1C: 6 % — AB (ref 4.0–5.6)

## 2023-11-11 MED ORDER — BUPROPION HCL ER (SR) 100 MG PO TB12: 100.0000 mg | ORAL_TABLET | Freq: Every morning | ORAL | 2 refills | Status: DC

## 2023-11-11 MED ORDER — ROSUVASTATIN CALCIUM 5 MG PO TABS: ORAL_TABLET | ORAL | 1 refills | Status: DC

## 2023-11-11 MED ORDER — LISINOPRIL 20 MG PO TABS: 20.0000 mg | ORAL_TABLET | Freq: Every day | ORAL | 1 refills | Status: DC

## 2023-11-11 MED ORDER — AMLODIPINE BESYLATE 5 MG PO TABS: 5.0000 mg | ORAL_TABLET | Freq: Every day | ORAL | 1 refills | Status: DC

## 2023-11-11 NOTE — Progress Notes (Unsigned)
   Established Patient Office Visit  Subjective   Patient ID: Margaret Hahn, female    DOB: 06-23-1986  Age: 38 y.o. MRN: [id number]  Chief Complaint  Patient presents with   Medical Management of Chronic Issues   Vaginal Pain    X1 week, denies discharge and has history of BV    Vaginal Pain    Current Outpatient Medications  Medication Instructions   amLODipine  (NORVASC ) 5 mg, Oral, Daily   buPROPion  ER (WELLBUTRIN  SR) 100 mg, Oral, Every morning   cetirizine (ZYRTEC) 10 mg, Daily   cyclobenzaprine  (FLEXERIL ) 10 mg, Oral, 2 times daily PRN   dicyclomine  (BENTYL ) 20 mg, Oral, 2 times daily   ibuprofen  (ADVIL ) 600 mg, Oral, Every 6 hours PRN   lisinopril  (ZESTRIL ) 20 mg, Oral, Daily   metFORMIN (GLUCOPHAGE) 500 mg, Oral, 2 times daily with meals   ondansetron  (ZOFRAN ) 4 mg, Oral, Every 6 hours   rosuvastatin  (CRESTOR ) 5 MG tablet Take 1/2 tablet (2.5 mg total) by mouth daily.    Patient Active Problem List   Diagnosis Date Noted   Morbid obesity (HCC) 01/25/2023   Prediabetes 01/25/2023   Uterine polyp 11/06/2021   Abnormal uterine bleeding (AUB) 11/04/2021   Leiomyoma of uterus 11/04/2021   Pelvic pain 11/04/2021   PCOS (polycystic ovarian syndrome) 04/25/2013   Metrorrhagia 04/25/2013   HTN (hypertension) 04/25/2013      Review of Systems  Genitourinary:  Positive for vaginal pain.  All other systems reviewed and are negative.     Objective:     BP 138/88   Pulse 80   Temp 98.2 F (36.8 C) (Oral)   Ht 5\' 4"  (1.626 m)   Wt (!) 316 lb 8 oz (143.6 kg)   LMP 08/25/2023 (Exact Date)   SpO2 96%   BMI 54.33 kg/m  {Vitals History (Optional):23777}  Physical Exam   Results for orders placed or performed in visit on 11/11/23  POC HgB A1c  Result Value Ref Range   Hemoglobin A1C 6.0 (A) 4.0 - 5.6 %   HbA1c POC (<> result, manual entry)     HbA1c, POC (prediabetic range)     HbA1c, POC (controlled diabetic range)      {Labs  (Optional):23779}  The ASCVD Risk score (Arnett DK, et al., 2019) failed to calculate for the following reasons:   The 2019 ASCVD risk score is only valid for ages 12 to 18    Assessment & Plan:  Prediabetes -     POCT glycosylated hemoglobin (Hb A1C) -     Collection capillary blood specimen -     Rosuvastatin  Calcium ; Take 1/2 tablet (2.5 mg total) by mouth daily.  Dispense: 45 tablet; Refill: 1  Primary hypertension -     amLODIPine  Besylate; Take 1 tablet (5 mg total) by mouth daily.  Dispense: 90 tablet; Refill: 1 -     Lisinopril ; Take 1 tablet (20 mg total) by mouth daily.  Dispense: 90 tablet; Refill: 1  Morbid obesity (HCC) -     buPROPion  HCl ER (SR); Take 1 tablet (100 mg total) by mouth in the morning.  Dispense: 30 tablet; Refill: 2 -     Amb Referral to Bariatric Surgery     Return in about 6 months (around 05/13/2024) for annual physical exam.    Aida House, MD

## 2023-11-14 ENCOUNTER — Encounter: Payer: Self-pay | Admitting: Family Medicine

## 2023-11-14 LAB — CERVICOVAGINAL ANCILLARY ONLY
Bacterial Vaginitis (gardnerella): NEGATIVE
Candida Glabrata: NEGATIVE
Candida Vaginitis: NEGATIVE
Chlamydia: NEGATIVE
Comment: NEGATIVE
Comment: NEGATIVE
Comment: NEGATIVE
Comment: NEGATIVE
Comment: NEGATIVE
Comment: NORMAL
Neisseria Gonorrhea: NEGATIVE
Trichomonas: NEGATIVE

## 2023-11-14 NOTE — Assessment & Plan Note (Signed)
 Chronic, now it is under 140 which is her goal. Continue amlodipine  5 mg daily and lisinopril  20 mg daily. I think that the weight loss will dramatically improve her blood pressure, which is a comorbid condition.

## 2023-11-14 NOTE — Assessment & Plan Note (Addendum)
 A1C improving, down to 6.0, will continue to monitor every 6 months. Continue metformin 500 mg BID

## 2023-11-14 NOTE — Assessment & Plan Note (Signed)
 Unable to get zepbound  or any GLP medication for the patient due to lack of coverage. I counseled the patient on bariatric surgery for weight loss and she is agreeable to the referral. Will continue the wellbutrin  100 mg daily to help reduce cravings.

## 2024-04-12 ENCOUNTER — Other Ambulatory Visit: Payer: Self-pay | Admitting: Family Medicine

## 2024-04-12 DIAGNOSIS — I10 Essential (primary) hypertension: Secondary | ICD-10-CM

## 2024-05-02 DIAGNOSIS — N898 Other specified noninflammatory disorders of vagina: Secondary | ICD-10-CM | POA: Diagnosis not present

## 2024-05-02 DIAGNOSIS — N760 Acute vaginitis: Secondary | ICD-10-CM | POA: Diagnosis not present

## 2024-05-02 DIAGNOSIS — B3731 Acute candidiasis of vulva and vagina: Secondary | ICD-10-CM | POA: Diagnosis not present

## 2024-05-14 ENCOUNTER — Ambulatory Visit (INDEPENDENT_AMBULATORY_CARE_PROVIDER_SITE_OTHER): Admitting: Family Medicine

## 2024-05-14 ENCOUNTER — Encounter: Payer: Self-pay | Admitting: Family Medicine

## 2024-05-14 ENCOUNTER — Ambulatory Visit: Payer: Self-pay | Admitting: Family Medicine

## 2024-05-14 VITALS — BP 140/100 | HR 80 | Temp 98.6°F | Ht 64.0 in | Wt 321.7 lb

## 2024-05-14 DIAGNOSIS — I10 Essential (primary) hypertension: Secondary | ICD-10-CM | POA: Diagnosis not present

## 2024-05-14 DIAGNOSIS — Z0000 Encounter for general adult medical examination without abnormal findings: Secondary | ICD-10-CM

## 2024-05-14 DIAGNOSIS — R7303 Prediabetes: Secondary | ICD-10-CM | POA: Diagnosis not present

## 2024-05-14 DIAGNOSIS — Z13220 Encounter for screening for lipoid disorders: Secondary | ICD-10-CM | POA: Diagnosis not present

## 2024-05-14 DIAGNOSIS — R400 Somnolence: Secondary | ICD-10-CM | POA: Diagnosis not present

## 2024-05-14 LAB — POCT GLYCOSYLATED HEMOGLOBIN (HGB A1C): Hemoglobin A1C: 6.3 % — AB (ref 4.0–5.6)

## 2024-05-14 LAB — LIPID PANEL
Cholesterol: 146 mg/dL (ref 0–200)
HDL: 61.2 mg/dL (ref >39.00)
LDL Cholesterol: 66 mg/dL (ref 0–99)
NonHDL: 84.97
Total CHOL/HDL Ratio: 2
Triglycerides: 95 mg/dL (ref 0.0–149.0)
VLDL: 19 mg/dL (ref 0.0–40.0)

## 2024-05-14 MED ORDER — AMLODIPINE BESYLATE 5 MG PO TABS: 5.0000 mg | ORAL_TABLET | Freq: Every day | ORAL | 1 refills | Status: DC

## 2024-05-14 MED ORDER — METFORMIN HCL 500 MG PO TABS
500.0000 mg | ORAL_TABLET | Freq: Two times a day (BID) | ORAL | 1 refills | Status: AC
Start: 2024-05-14 — End: ?

## 2024-05-14 MED ORDER — ROSUVASTATIN CALCIUM 5 MG PO TABS
ORAL_TABLET | ORAL | 1 refills | Status: DC
Start: 2024-05-14 — End: 2024-06-04

## 2024-05-14 NOTE — Assessment & Plan Note (Signed)
 BP is elevated today,, recheck was 150/100, I advised patient to check her BP at home, if it is also elevated at home then we will need to increase her medication, RTC in 3 months for BP recheck

## 2024-05-14 NOTE — Progress Notes (Signed)
 Complete physical exam  Patient: Margaret Hahn   DOB: [birth year]   38 y.o. Female  MRN: [id number]  Subjective:    Chief Complaint  Patient presents with   Annual Exam    Margaret Hahn is a 38 y.o. female who presents today for a complete physical exam. She reports consuming a general diet. Pt eats fast food about 4 times per week, does eat some chips, does eat processed foods, drinks zero sugar sodas.  Home exercise routine includes walking 2 hrs per week. She generally feels well. She reports sleeping fairly well. Is having excessive snoring and is waking up in the middle of the night. States that she does not really feel rested when she wakes up. She does not have additional problems to discuss today.    Most recent fall risk assessment:     No data to display           Most recent depression screenings:    08/04/2023   11:22 AM 01/25/2023   10:15 AM  PHQ 2/9 Scores  PHQ - 2 Score 4 1  PHQ- 9 Score 14     Vision:Within last year and Dental: No current dental problems and Receives regular dental care  Patient Active Problem List   Diagnosis Date Noted   Morbid obesity (HCC) 01/25/2023   Prediabetes 01/25/2023   Uterine polyp 11/06/2021   Abnormal uterine bleeding (AUB) 11/04/2021   Leiomyoma of uterus 11/04/2021   Pelvic pain 11/04/2021   PCOS (polycystic ovarian syndrome) 04/25/2013   Metrorrhagia 04/25/2013   HTN (hypertension) 04/25/2013      Patient Care Team: Ozell Heron HERO, MD as PCP - General (Family Medicine) System, Provider Not In   Outpatient Medications Prior to Visit  Medication Sig   cetirizine (ZYRTEC) 10 MG tablet Take 10 mg by mouth daily.   ibuprofen  (ADVIL ) 600 MG tablet Take 1 tablet (600 mg total) by mouth every 6 (six) hours as needed.   lisinopril  (ZESTRIL ) 20 MG tablet Take 1 tablet (20 mg total) by mouth daily.   [DISCONTINUED] amLODipine  (NORVASC ) 5 MG tablet Take 1 tablet (5 mg total) by mouth daily.   [DISCONTINUED]  metFORMIN (GLUCOPHAGE) 500 MG tablet Take 500 mg by mouth 2 (two) times daily with a meal.   [DISCONTINUED] rosuvastatin  (CRESTOR ) 5 MG tablet Take 1/2 tablet (2.5 mg total) by mouth daily.   [DISCONTINUED] buPROPion  ER (WELLBUTRIN  SR) 100 MG 12 hr tablet Take 1 tablet (100 mg total) by mouth in the morning.   [DISCONTINUED] cyclobenzaprine  (FLEXERIL ) 10 MG tablet Take 1 tablet (10 mg total) by mouth 2 (two) times daily as needed for muscle spasms.   [DISCONTINUED] dicyclomine  (BENTYL ) 20 MG tablet Take 1 tablet (20 mg total) by mouth 2 (two) times daily.   [DISCONTINUED] ondansetron  (ZOFRAN ) 4 MG tablet Take 1 tablet (4 mg total) by mouth every 6 (six) hours.   No facility-administered medications prior to visit.    Review of Systems  All other systems reviewed and are negative.      Objective:     BP (!) 140/100   Pulse 80   Temp 98.6 F (37 C) (Oral)   Ht 5' 4 (1.626 m)   Wt (!) 321 lb 11.2 oz (145.9 kg)   LMP 04/20/2024 (Exact Date)   SpO2 100%   BMI 55.22 kg/m    Physical Exam Vitals reviewed.  Constitutional:      Appearance: Normal appearance. She is well-groomed. She is morbidly obese.  HENT:     Right Ear: Tympanic membrane and ear canal normal.     Left Ear: Tympanic membrane and ear canal normal.     Mouth/Throat:     Mouth: Mucous membranes are moist.     Pharynx: No posterior oropharyngeal erythema.  Eyes:     Conjunctiva/sclera: Conjunctivae normal.  Neck:     Thyroid : No thyromegaly.  Cardiovascular:     Rate and Rhythm: Normal rate and regular rhythm.     Pulses: Normal pulses.     Heart sounds: S1 normal and S2 normal.  Pulmonary:     Effort: Pulmonary effort is normal.     Breath sounds: Normal breath sounds and air entry.  Abdominal:     General: Abdomen is flat. Bowel sounds are normal.     Palpations: Abdomen is soft.  Musculoskeletal:     Right lower leg: No edema.     Left lower leg: No edema.  Lymphadenopathy:     Cervical: No  cervical adenopathy.  Neurological:     Mental Status: She is alert and oriented to person, place, and time. Mental status is at baseline.     Gait: Gait is intact.  Psychiatric:        Mood and Affect: Mood and affect normal.        Speech: Speech normal.        Behavior: Behavior normal.        Judgment: Judgment normal.      Results for orders placed or performed in visit on 05/14/24  Lipid panel  Result Value Ref Range   Cholesterol 146 0 - 200 mg/dL   Triglycerides 04.9 0.0 - 149.0 mg/dL   HDL 38.79 >60.99 mg/dL   VLDL 80.9 0.0 - 59.9 mg/dL   LDL Cholesterol 66 0 - 99 mg/dL   Total CHOL/HDL Ratio 2    NonHDL 84.97   POC HgB A1c  Result Value Ref Range   Hemoglobin A1C 6.3 (A) 4.0 - 5.6 %   HbA1c POC (<> result, manual entry)     HbA1c, POC (prediabetic range)     HbA1c, POC (controlled diabetic range)         Assessment & Plan:    Routine Health Maintenance and Physical Exam  Immunization History  Administered Date(s) Administered   Moderna Sars-Covid-2 Vaccination 02/28/2020, 03/28/2020   PPD Test 08/26/2021   Tdap 01/25/2016    Health Maintenance  Topic Date Due   Hepatitis B Vaccines 19-59 Average Risk (1 of 3 - 19+ 3-dose series) Never done   HPV VACCINES (1 - 3-dose SCDM series) Never done   Cervical Cancer Screening (HPV/Pap Cotest)  07/08/2017   COVID-19 Vaccine (3 - 2025-26 season) 03/12/2024   Influenza Vaccine  10/09/2024 (Originally 02/10/2024)   Hepatitis C Screening  05/14/2025 (Originally 11/25/2002)   DTaP/Tdap/Td (2 - Td or Tdap) 01/24/2026   HIV Screening  Completed   Pneumococcal Vaccine  Aged Out   Meningococcal B Vaccine  Aged Out    Discussed health benefits of physical activity, and encouraged her to engage in regular exercise appropriate for her age and condition.  Prediabetes Assessment & Plan: A1C has increased despite metformin use, I have refilled it for her today, I will see her back in 3 months for recheck.  Orders: -      POCT glycosylated hemoglobin (Hb A1C) -     Collection capillary blood specimen -     Rosuvastatin  Calcium ; Take 1/2 tablet (2.5 mg total)  by mouth daily.  Dispense: 45 tablet; Refill: 1 -     metFORMIN HCl; Take 1 tablet (500 mg total) by mouth 2 (two) times daily with a meal.  Dispense: 180 tablet; Refill: 1  Lipid screening -     Lipid panel; Future  Primary hypertension Assessment & Plan: BP is elevated today,, recheck was 150/100, I advised patient to check her BP at home, if it is also elevated at home then we will need to increase her medication, RTC in 3 months for BP recheck  Orders: -     amLODIPine  Besylate; Take 1 tablet (5 mg total) by mouth daily.  Dispense: 90 tablet; Refill: 1  Daytime somnolence -     Ambulatory referral to Sleep Studies  Routine general medical examination at a health care facility  General physical exam findings are normal today. I reviewed the patient's preventative testing, immunizations, and lifestyle habits. I made appropriate recommendations and placed orders for the appropriate tests and/or vaccinations. I counseled the patient on the CDC's recommendations for healthy exercise and diet. I counseled the patient on healthy sleep habits and stress management. Handouts to reinforce the counseling were given at the conclusion of the visit.    Return in 3 months (on 08/14/2024) for HTN, prediabetes.     Heron CHRISTELLA Sharper, MD

## 2024-05-14 NOTE — Assessment & Plan Note (Signed)
 A1C has increased despite metformin use, I have refilled it for her today, I will see her back in 3 months for recheck.

## 2024-05-14 NOTE — Patient Instructions (Addendum)
 [email or web address]  [email or web address]  [email or web address]  [email or web address]  Health Maintenance, Female Adopting a healthy lifestyle and getting preventive care are important in promoting health and wellness. Ask your health care provider about: The right schedule for you to have regular tests and exams. Things you can do on your own to prevent diseases and keep yourself healthy. What should I know about diet, weight, and exercise? Eat a healthy diet  Eat a diet that includes plenty of vegetables, fruits, low-fat dairy products, and lean protein. Do not eat a lot of foods that are high in solid fats, added sugars, or sodium. Maintain a healthy weight Body mass index (BMI) is used to identify weight problems. It estimates body fat based on height and weight. Your health care provider can help determine your BMI and help you achieve or maintain a healthy weight. Get regular exercise Get regular exercise. This is one of the most important things you can do for your health. Most adults should: Exercise for at least 150 minutes each week. The exercise should increase your heart rate and make you sweat (moderate-intensity exercise). Do strengthening exercises at least twice a week. This is in addition to the moderate-intensity exercise. Spend less time sitting. Even light physical activity can be beneficial. Watch cholesterol and blood lipids Have your blood tested for lipids and cholesterol at 39 years of age, then have this test every 5 years. Have your cholesterol levels checked more often if: Your lipid or cholesterol levels are high. You are older than 39 years of age. You are at high risk for heart disease. What should I know about cancer screening? Depending on your health history and family history, you may need to have cancer screening at various ages. This may include screening for: Breast cancer. Cervical cancer. Colorectal cancer. Skin cancer. Lung cancer. What  should I know about heart disease, diabetes, and high blood pressure? Blood pressure and heart disease High blood pressure causes heart disease and increases the risk of stroke. This is more likely to develop in people who have high blood pressure readings or are overweight. Have your blood pressure checked: Every 3-5 years if you are 39-61 years of age. Every year if you are 39 years old or older. Diabetes Have regular diabetes screenings. This checks your fasting blood sugar level. Have the screening done: Once every three years after age 39 if you are at a normal weight and have a low risk for diabetes. More often and at a younger age if you are overweight or have a high risk for diabetes. What should I know about preventing infection? Hepatitis B If you have a higher risk for hepatitis B, you should be screened for this virus. Talk with your health care provider to find out if you are at risk for hepatitis B infection. Hepatitis C Testing is recommended for: Everyone born from [birth year] through 1966. Anyone with known risk factors for hepatitis C. Sexually transmitted infections (STIs) Get screened for STIs, including gonorrhea and chlamydia, if: You are sexually active and are younger than 39 years of age. You are older than 39 years of age and your health care provider tells you that you are at risk for this type of infection. Your sexual activity has changed since you were last screened, and you are at increased risk for chlamydia or gonorrhea. Ask your health care provider if you are at risk. Ask your health care provider about whether you are at high risk for HIV. Your health care provider may  recommend a prescription medicine to help prevent HIV infection. If you choose to take medicine to prevent HIV, you should first get tested for HIV. You should then be tested every 3 months for as long as you are taking the medicine. Pregnancy If you are about to stop having your period  (premenopausal) and you may become pregnant, seek counseling before you get pregnant. Take 400 to 800 micrograms (mcg) of folic acid every day if you become pregnant. Ask for birth control (contraception) if you want to prevent pregnancy. Osteoporosis and menopause Osteoporosis is a disease in which the bones lose minerals and strength with aging. This can result in bone fractures. If you are 39 years old or older, or if you are at risk for osteoporosis and fractures, ask your health care provider if you should: Be screened for bone loss. Take a calcium  or vitamin D supplement to lower your risk of fractures. Be given hormone replacement therapy (HRT) to treat symptoms of menopause. Follow these instructions at home: Alcohol use Do not drink alcohol if: Your health care provider tells you not to drink. You are pregnant, may be pregnant, or are planning to become pregnant. If you drink alcohol: Limit how much you have to: 0-1 drink a day. Know how much alcohol is in your drink. In the [place] one drink equals one 12 oz bottle of beer (355 mL), one 5 oz glass of wine (148 mL), or one 1 oz glass of hard liquor (44 mL). Lifestyle Do not use any products that contain nicotine or tobacco. These products include cigarettes, chewing tobacco, and vaping devices, such as e-cigarettes. If you need help quitting, ask your health care provider. Do not use street drugs. Do not share needles. Ask your health care provider for help if you need support or information about quitting drugs. General instructions Schedule regular health, dental, and eye exams. Stay current with your vaccines. Tell your health care provider if: You often feel depressed. You have ever been abused or do not feel safe at home. Summary Adopting a healthy lifestyle and getting preventive care are important in promoting health and wellness. Follow your health care provider's instructions about healthy diet, exercising, and getting  tested or screened for diseases. Follow your health care provider's instructions on monitoring your cholesterol and blood pressure. This information is not intended to replace advice given to you by your health care provider. Make sure you discuss any questions you have with your health care provider. Document Revised: 11/17/2020 Document Reviewed: 11/17/2020 Elsevier Patient Education  2024 [employer].

## 2024-06-04 ENCOUNTER — Other Ambulatory Visit: Payer: Self-pay | Admitting: Family Medicine

## 2024-06-04 DIAGNOSIS — I10 Essential (primary) hypertension: Secondary | ICD-10-CM

## 2024-06-04 DIAGNOSIS — R7303 Prediabetes: Secondary | ICD-10-CM

## 2024-07-04 ENCOUNTER — Telehealth (INDEPENDENT_AMBULATORY_CARE_PROVIDER_SITE_OTHER): Payer: Self-pay

## 2024-07-04 DIAGNOSIS — I10 Essential (primary) hypertension: Secondary | ICD-10-CM

## 2024-07-04 NOTE — Progress Notes (Addendum)
" ° °  07/04/2024  Patient ID: Margaret Hahn, female   DOB: [birth year], 38 y.o.   MRN: [id number]  Pharmacy Quality Measure Review  This patient is appearing on a report for being at risk of failing the Controlling Blood Pressure measure this calendar year.   Last documented BP 140/100 on 05/14/24   Spoke with patient via telephone. Confirms she has been routinely monitoring BP at home since last follow up with PCP. Checks 2-3 times a week. Most recent reading was 138/80 at home. Encouraged patient to continue med therapy and at home monitoring. Reminded of f/u visit with PCP.  Jon VEAR Lindau, PharmD Clinical Pharmacist [phone]    "

## 2024-08-14 ENCOUNTER — Ambulatory Visit: Admitting: Family Medicine

## 2024-08-31 ENCOUNTER — Ambulatory Visit: Admitting: Family Medicine
